# Patient Record
Sex: Male | Born: 1965 | Race: White | Hispanic: No | Smoking: Former smoker
Health system: Southern US, Community
[De-identification: ages and names within clinical notes are randomized; demographics above are authoritative.]

## PROBLEM LIST (undated history)

## (undated) DIAGNOSIS — F603 Borderline personality disorder: Secondary | ICD-10-CM

## (undated) DIAGNOSIS — R569 Unspecified convulsions: Secondary | ICD-10-CM

## (undated) DIAGNOSIS — M25519 Pain in unspecified shoulder: Secondary | ICD-10-CM

## (undated) DIAGNOSIS — G8929 Other chronic pain: Secondary | ICD-10-CM

---

## 2001-09-22 ENCOUNTER — Emergency Department (HOSPITAL_COMMUNITY): Admission: EM | Admit: 2001-09-22 | Discharge: 2001-09-22 | Payer: Self-pay | Admitting: *Deleted

## 2001-09-22 ENCOUNTER — Encounter: Payer: Self-pay | Admitting: *Deleted

## 2005-04-01 ENCOUNTER — Ambulatory Visit (HOSPITAL_COMMUNITY): Admission: RE | Admit: 2005-04-01 | Discharge: 2005-04-01 | Payer: Self-pay | Admitting: Family Medicine

## 2005-07-20 ENCOUNTER — Ambulatory Visit (HOSPITAL_COMMUNITY): Admission: RE | Admit: 2005-07-20 | Discharge: 2005-07-20 | Payer: Self-pay | Admitting: Family Medicine

## 2006-02-23 ENCOUNTER — Ambulatory Visit: Payer: Self-pay | Admitting: Family Medicine

## 2006-02-23 DIAGNOSIS — Q6500 Congenital dislocation of unspecified hip, unilateral: Secondary | ICD-10-CM

## 2006-02-23 DIAGNOSIS — R569 Unspecified convulsions: Secondary | ICD-10-CM | POA: Insufficient documentation

## 2006-02-23 DIAGNOSIS — G43009 Migraine without aura, not intractable, without status migrainosus: Secondary | ICD-10-CM | POA: Insufficient documentation

## 2006-02-23 DIAGNOSIS — F341 Dysthymic disorder: Secondary | ICD-10-CM | POA: Insufficient documentation

## 2006-02-24 LAB — CONVERTED CEMR LAB
Alkaline Phosphatase: 77 units/L (ref 39–117)
Basophils Absolute: 0 10*3/uL (ref 0.0–0.1)
Creatinine, Ser: 0.84 mg/dL (ref 0.40–1.50)
Eosinophils Absolute: 0.1 10*3/uL (ref 0.0–0.7)
Eosinophils Relative: 2 % (ref 0–5)
Glucose, Bld: 87 mg/dL (ref 70–99)
HCT: 44.9 % (ref 39.0–52.0)
Hemoglobin: 15 g/dL (ref 13.0–17.0)
Lymphs Abs: 2.3 10*3/uL (ref 0.7–3.3)
MCV: 97.8 fL (ref 78.0–100.0)
Monocytes Absolute: 0.5 10*3/uL (ref 0.2–0.7)
Phenobarbital: 18.9 ug/mL (ref 15.0–40.0)
Platelets: 268 10*3/uL (ref 150–400)
RDW: 11.9 % (ref 11.5–14.0)
Sodium: 139 meq/L (ref 135–145)
TSH: 1.379 microintl units/mL (ref 0.350–5.50)
Total Bilirubin: 0.6 mg/dL (ref 0.3–1.2)
Total Protein: 7.2 g/dL (ref 6.0–8.3)

## 2006-02-27 ENCOUNTER — Encounter (INDEPENDENT_AMBULATORY_CARE_PROVIDER_SITE_OTHER): Payer: Self-pay | Admitting: Family Medicine

## 2006-03-08 ENCOUNTER — Encounter (INDEPENDENT_AMBULATORY_CARE_PROVIDER_SITE_OTHER): Payer: Self-pay | Admitting: Family Medicine

## 2006-03-09 ENCOUNTER — Encounter (INDEPENDENT_AMBULATORY_CARE_PROVIDER_SITE_OTHER): Payer: Self-pay | Admitting: Family Medicine

## 2006-04-22 ENCOUNTER — Telehealth (INDEPENDENT_AMBULATORY_CARE_PROVIDER_SITE_OTHER): Payer: Self-pay | Admitting: Family Medicine

## 2006-04-27 ENCOUNTER — Encounter (INDEPENDENT_AMBULATORY_CARE_PROVIDER_SITE_OTHER): Payer: Self-pay | Admitting: Family Medicine

## 2006-05-12 ENCOUNTER — Ambulatory Visit: Payer: Self-pay | Admitting: Family Medicine

## 2006-05-14 ENCOUNTER — Encounter (INDEPENDENT_AMBULATORY_CARE_PROVIDER_SITE_OTHER): Payer: Self-pay | Admitting: Family Medicine

## 2006-05-14 DIAGNOSIS — M545 Low back pain: Secondary | ICD-10-CM

## 2006-05-17 ENCOUNTER — Encounter (INDEPENDENT_AMBULATORY_CARE_PROVIDER_SITE_OTHER): Payer: Self-pay | Admitting: Family Medicine

## 2006-05-21 ENCOUNTER — Telehealth (INDEPENDENT_AMBULATORY_CARE_PROVIDER_SITE_OTHER): Payer: Self-pay | Admitting: *Deleted

## 2006-05-24 ENCOUNTER — Telehealth (INDEPENDENT_AMBULATORY_CARE_PROVIDER_SITE_OTHER): Payer: Self-pay | Admitting: *Deleted

## 2006-05-24 ENCOUNTER — Ambulatory Visit (HOSPITAL_COMMUNITY): Admission: RE | Admit: 2006-05-24 | Discharge: 2006-05-24 | Payer: Self-pay | Admitting: Family Medicine

## 2006-06-11 ENCOUNTER — Encounter (INDEPENDENT_AMBULATORY_CARE_PROVIDER_SITE_OTHER): Payer: Self-pay | Admitting: Family Medicine

## 2006-06-25 ENCOUNTER — Encounter (INDEPENDENT_AMBULATORY_CARE_PROVIDER_SITE_OTHER): Payer: Self-pay | Admitting: Family Medicine

## 2006-06-25 ENCOUNTER — Ambulatory Visit: Payer: Self-pay | Admitting: Internal Medicine

## 2006-06-25 ENCOUNTER — Telehealth (INDEPENDENT_AMBULATORY_CARE_PROVIDER_SITE_OTHER): Payer: Self-pay | Admitting: Family Medicine

## 2006-06-25 ENCOUNTER — Ambulatory Visit (HOSPITAL_COMMUNITY): Admission: RE | Admit: 2006-06-25 | Discharge: 2006-06-25 | Payer: Self-pay | Admitting: Family Medicine

## 2006-06-28 ENCOUNTER — Ambulatory Visit: Payer: Self-pay | Admitting: Family Medicine

## 2006-07-08 ENCOUNTER — Encounter (INDEPENDENT_AMBULATORY_CARE_PROVIDER_SITE_OTHER): Payer: Self-pay | Admitting: Family Medicine

## 2006-07-12 ENCOUNTER — Encounter (INDEPENDENT_AMBULATORY_CARE_PROVIDER_SITE_OTHER): Payer: Self-pay | Admitting: Family Medicine

## 2006-07-14 ENCOUNTER — Ambulatory Visit: Payer: Self-pay | Admitting: Family Medicine

## 2006-07-14 ENCOUNTER — Telehealth (INDEPENDENT_AMBULATORY_CARE_PROVIDER_SITE_OTHER): Payer: Self-pay | Admitting: *Deleted

## 2006-07-15 ENCOUNTER — Encounter (INDEPENDENT_AMBULATORY_CARE_PROVIDER_SITE_OTHER): Payer: Self-pay | Admitting: Family Medicine

## 2006-07-16 ENCOUNTER — Telehealth (INDEPENDENT_AMBULATORY_CARE_PROVIDER_SITE_OTHER): Payer: Self-pay | Admitting: Family Medicine

## 2006-07-30 ENCOUNTER — Encounter (INDEPENDENT_AMBULATORY_CARE_PROVIDER_SITE_OTHER): Payer: Self-pay | Admitting: Family Medicine

## 2006-08-24 ENCOUNTER — Encounter (INDEPENDENT_AMBULATORY_CARE_PROVIDER_SITE_OTHER): Payer: Self-pay | Admitting: Family Medicine

## 2006-10-04 ENCOUNTER — Telehealth (INDEPENDENT_AMBULATORY_CARE_PROVIDER_SITE_OTHER): Payer: Self-pay | Admitting: Family Medicine

## 2006-10-04 ENCOUNTER — Encounter (INDEPENDENT_AMBULATORY_CARE_PROVIDER_SITE_OTHER): Payer: Self-pay | Admitting: Family Medicine

## 2006-10-08 ENCOUNTER — Ambulatory Visit: Payer: Self-pay | Admitting: Family Medicine

## 2006-12-14 ENCOUNTER — Ambulatory Visit: Payer: Self-pay | Admitting: Family Medicine

## 2006-12-15 ENCOUNTER — Encounter (INDEPENDENT_AMBULATORY_CARE_PROVIDER_SITE_OTHER): Payer: Self-pay | Admitting: Family Medicine

## 2006-12-15 ENCOUNTER — Telehealth (INDEPENDENT_AMBULATORY_CARE_PROVIDER_SITE_OTHER): Payer: Self-pay | Admitting: *Deleted

## 2006-12-15 LAB — CONVERTED CEMR LAB
Albumin: 4.2 g/dL (ref 3.5–5.2)
BUN: 12 mg/dL (ref 6–23)
Basophils Relative: 0 % (ref 0–1)
CO2: 23 meq/L (ref 19–32)
Calcium: 9 mg/dL (ref 8.4–10.5)
Carbamazepine Lvl: 2.8 ug/mL — ABNORMAL LOW (ref 4.0–12.0)
Chloride: 102 meq/L (ref 96–112)
Creatinine, Ser: 0.85 mg/dL (ref 0.40–1.50)
Glucose, Bld: 87 mg/dL (ref 70–99)
Hemoglobin: 14.4 g/dL (ref 13.0–17.0)
Lymphocytes Relative: 42 % (ref 12–46)
Lymphs Abs: 2.6 10*3/uL (ref 0.7–4.0)
MCHC: 33.1 g/dL (ref 30.0–36.0)
Monocytes Absolute: 0.4 10*3/uL (ref 0.1–1.0)
Monocytes Relative: 6 % (ref 3–12)
Neutro Abs: 3.1 10*3/uL (ref 1.7–7.7)
Neutrophils Relative %: 50 % (ref 43–77)
Phenobarbital: 27.5 ug/mL (ref 15.0–40.0)
Potassium: 4.4 meq/L (ref 3.5–5.3)
RBC: 4.26 M/uL (ref 4.22–5.81)
TSH: 1.645 microintl units/mL (ref 0.350–5.50)
WBC: 6.2 10*3/uL (ref 4.0–10.5)

## 2006-12-20 ENCOUNTER — Encounter (INDEPENDENT_AMBULATORY_CARE_PROVIDER_SITE_OTHER): Payer: Self-pay | Admitting: Family Medicine

## 2006-12-27 ENCOUNTER — Emergency Department (HOSPITAL_COMMUNITY): Admission: EM | Admit: 2006-12-27 | Discharge: 2006-12-27 | Payer: Self-pay | Admitting: Emergency Medicine

## 2007-01-03 ENCOUNTER — Encounter (INDEPENDENT_AMBULATORY_CARE_PROVIDER_SITE_OTHER): Payer: Self-pay | Admitting: Family Medicine

## 2007-01-04 ENCOUNTER — Ambulatory Visit: Payer: Self-pay | Admitting: Family Medicine

## 2007-01-31 ENCOUNTER — Encounter (INDEPENDENT_AMBULATORY_CARE_PROVIDER_SITE_OTHER): Payer: Self-pay | Admitting: Family Medicine

## 2007-02-01 ENCOUNTER — Telehealth (INDEPENDENT_AMBULATORY_CARE_PROVIDER_SITE_OTHER): Payer: Self-pay | Admitting: *Deleted

## 2007-02-01 LAB — CONVERTED CEMR LAB
ALT: 16 units/L (ref 0–53)
Albumin: 4.7 g/dL (ref 3.5–5.2)
Alkaline Phosphatase: 91 units/L (ref 39–117)
Basophils Absolute: 0 10*3/uL (ref 0.0–0.1)
CO2: 29 meq/L (ref 19–32)
Eosinophils Absolute: 0.1 10*3/uL (ref 0.0–0.7)
Eosinophils Relative: 2 % (ref 0–5)
Glucose, Bld: 90 mg/dL (ref 70–99)
HCT: 44.4 % (ref 39.0–52.0)
Lymphocytes Relative: 42 % (ref 12–46)
Lymphs Abs: 2.2 10*3/uL (ref 0.7–4.0)
Neutrophils Relative %: 50 % (ref 43–77)
Platelets: 232 10*3/uL (ref 150–400)
Potassium: 4.7 meq/L (ref 3.5–5.3)
RDW: 12.2 % (ref 11.5–15.5)
Sodium: 140 meq/L (ref 135–145)
Total Bilirubin: 0.2 mg/dL — ABNORMAL LOW (ref 0.3–1.2)
Total Protein: 7.2 g/dL (ref 6.0–8.3)
WBC: 5.3 10*3/uL (ref 4.0–10.5)

## 2007-02-03 ENCOUNTER — Ambulatory Visit: Payer: Self-pay | Admitting: Family Medicine

## 2007-02-14 ENCOUNTER — Encounter (INDEPENDENT_AMBULATORY_CARE_PROVIDER_SITE_OTHER): Payer: Self-pay | Admitting: Family Medicine

## 2007-03-16 ENCOUNTER — Ambulatory Visit: Payer: Self-pay | Admitting: Family Medicine

## 2007-03-16 DIAGNOSIS — F988 Other specified behavioral and emotional disorders with onset usually occurring in childhood and adolescence: Secondary | ICD-10-CM | POA: Insufficient documentation

## 2007-03-16 DIAGNOSIS — F17200 Nicotine dependence, unspecified, uncomplicated: Secondary | ICD-10-CM

## 2007-03-21 ENCOUNTER — Encounter (INDEPENDENT_AMBULATORY_CARE_PROVIDER_SITE_OTHER): Payer: Self-pay | Admitting: Family Medicine

## 2007-03-21 ENCOUNTER — Telehealth (INDEPENDENT_AMBULATORY_CARE_PROVIDER_SITE_OTHER): Payer: Self-pay | Admitting: Family Medicine

## 2007-03-29 ENCOUNTER — Telehealth (INDEPENDENT_AMBULATORY_CARE_PROVIDER_SITE_OTHER): Payer: Self-pay | Admitting: *Deleted

## 2007-03-29 ENCOUNTER — Ambulatory Visit: Payer: Self-pay | Admitting: Family Medicine

## 2007-03-31 ENCOUNTER — Encounter (INDEPENDENT_AMBULATORY_CARE_PROVIDER_SITE_OTHER): Payer: Self-pay | Admitting: Family Medicine

## 2007-03-31 ENCOUNTER — Telehealth (INDEPENDENT_AMBULATORY_CARE_PROVIDER_SITE_OTHER): Payer: Self-pay | Admitting: *Deleted

## 2007-04-05 ENCOUNTER — Encounter (INDEPENDENT_AMBULATORY_CARE_PROVIDER_SITE_OTHER): Payer: Self-pay | Admitting: Family Medicine

## 2007-04-07 ENCOUNTER — Telehealth (INDEPENDENT_AMBULATORY_CARE_PROVIDER_SITE_OTHER): Payer: Self-pay | Admitting: *Deleted

## 2007-04-07 ENCOUNTER — Encounter (INDEPENDENT_AMBULATORY_CARE_PROVIDER_SITE_OTHER): Payer: Self-pay | Admitting: Family Medicine

## 2007-04-21 ENCOUNTER — Emergency Department (HOSPITAL_COMMUNITY): Admission: EM | Admit: 2007-04-21 | Discharge: 2007-04-22 | Payer: Self-pay | Admitting: Emergency Medicine

## 2007-04-22 ENCOUNTER — Ambulatory Visit: Payer: Self-pay | Admitting: *Deleted

## 2007-04-22 ENCOUNTER — Inpatient Hospital Stay (HOSPITAL_COMMUNITY): Admission: AD | Admit: 2007-04-22 | Discharge: 2007-04-25 | Payer: Self-pay | Admitting: *Deleted

## 2007-05-03 ENCOUNTER — Ambulatory Visit: Payer: Self-pay | Admitting: Family Medicine

## 2007-05-03 ENCOUNTER — Telehealth (INDEPENDENT_AMBULATORY_CARE_PROVIDER_SITE_OTHER): Payer: Self-pay | Admitting: Family Medicine

## 2007-05-16 ENCOUNTER — Telehealth (INDEPENDENT_AMBULATORY_CARE_PROVIDER_SITE_OTHER): Payer: Self-pay | Admitting: Family Medicine

## 2007-05-17 ENCOUNTER — Ambulatory Visit: Payer: Self-pay | Admitting: Family Medicine

## 2007-05-17 DIAGNOSIS — F528 Other sexual dysfunction not due to a substance or known physiological condition: Secondary | ICD-10-CM

## 2007-05-17 LAB — CONVERTED CEMR LAB
HDL goal, serum: 40 mg/dL
LDL Goal: 160 mg/dL

## 2007-06-14 ENCOUNTER — Ambulatory Visit: Payer: Self-pay | Admitting: Family Medicine

## 2007-06-14 DIAGNOSIS — B079 Viral wart, unspecified: Secondary | ICD-10-CM | POA: Insufficient documentation

## 2007-06-14 DIAGNOSIS — R350 Frequency of micturition: Secondary | ICD-10-CM

## 2007-06-14 LAB — CONVERTED CEMR LAB
Bilirubin Urine: NEGATIVE
Blood in Urine, dipstick: NEGATIVE
Glucose, Urine, Semiquant: NEGATIVE
Ketones, urine, test strip: NEGATIVE
Specific Gravity, Urine: 1.015

## 2007-07-13 ENCOUNTER — Ambulatory Visit: Payer: Self-pay | Admitting: Family Medicine

## 2007-08-01 ENCOUNTER — Telehealth (INDEPENDENT_AMBULATORY_CARE_PROVIDER_SITE_OTHER): Payer: Self-pay | Admitting: Family Medicine

## 2007-08-12 ENCOUNTER — Encounter (INDEPENDENT_AMBULATORY_CARE_PROVIDER_SITE_OTHER): Payer: Self-pay | Admitting: Family Medicine

## 2007-09-14 ENCOUNTER — Encounter (INDEPENDENT_AMBULATORY_CARE_PROVIDER_SITE_OTHER): Payer: Self-pay | Admitting: Family Medicine

## 2007-11-11 ENCOUNTER — Telehealth (INDEPENDENT_AMBULATORY_CARE_PROVIDER_SITE_OTHER): Payer: Self-pay | Admitting: *Deleted

## 2007-11-21 ENCOUNTER — Ambulatory Visit: Payer: Self-pay | Admitting: Family Medicine

## 2007-11-21 DIAGNOSIS — E669 Obesity, unspecified: Secondary | ICD-10-CM | POA: Insufficient documentation

## 2007-11-25 ENCOUNTER — Telehealth (INDEPENDENT_AMBULATORY_CARE_PROVIDER_SITE_OTHER): Payer: Self-pay | Admitting: *Deleted

## 2007-12-05 ENCOUNTER — Ambulatory Visit: Payer: Self-pay | Admitting: Family Medicine

## 2008-01-11 ENCOUNTER — Encounter (INDEPENDENT_AMBULATORY_CARE_PROVIDER_SITE_OTHER): Payer: Self-pay | Admitting: Family Medicine

## 2008-01-16 ENCOUNTER — Encounter (INDEPENDENT_AMBULATORY_CARE_PROVIDER_SITE_OTHER): Payer: Self-pay | Admitting: Family Medicine

## 2008-02-28 ENCOUNTER — Encounter (INDEPENDENT_AMBULATORY_CARE_PROVIDER_SITE_OTHER): Payer: Self-pay | Admitting: Family Medicine

## 2008-03-07 ENCOUNTER — Telehealth (INDEPENDENT_AMBULATORY_CARE_PROVIDER_SITE_OTHER): Payer: Self-pay | Admitting: *Deleted

## 2008-03-09 ENCOUNTER — Telehealth (INDEPENDENT_AMBULATORY_CARE_PROVIDER_SITE_OTHER): Payer: Self-pay | Admitting: *Deleted

## 2008-03-13 ENCOUNTER — Telehealth (INDEPENDENT_AMBULATORY_CARE_PROVIDER_SITE_OTHER): Payer: Self-pay | Admitting: *Deleted

## 2008-04-30 ENCOUNTER — Encounter (INDEPENDENT_AMBULATORY_CARE_PROVIDER_SITE_OTHER): Payer: Self-pay | Admitting: Family Medicine

## 2008-05-08 ENCOUNTER — Encounter (INDEPENDENT_AMBULATORY_CARE_PROVIDER_SITE_OTHER): Payer: Self-pay | Admitting: Family Medicine
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
## 2010-06-07 ENCOUNTER — Emergency Department (HOSPITAL_COMMUNITY)
Admission: EM | Admit: 2010-06-07 | Discharge: 2010-06-09 | Disposition: A | Discharge status: Other Acute Inpatient Hospital | Payer: Self-pay | Attending: Emergency Medicine | Admitting: Emergency Medicine

## 2010-06-07 DIAGNOSIS — F3289 Other specified depressive episodes: Secondary | ICD-10-CM | POA: Insufficient documentation

## 2010-06-07 DIAGNOSIS — T423X4A Poisoning by barbiturates, undetermined, initial encounter: Secondary | ICD-10-CM | POA: Insufficient documentation

## 2010-06-07 DIAGNOSIS — Z79899 Other long term (current) drug therapy: Secondary | ICD-10-CM | POA: Insufficient documentation

## 2010-06-07 DIAGNOSIS — E7800 Pure hypercholesterolemia, unspecified: Secondary | ICD-10-CM | POA: Insufficient documentation

## 2010-06-07 DIAGNOSIS — F329 Major depressive disorder, single episode, unspecified: Secondary | ICD-10-CM | POA: Insufficient documentation

## 2010-06-07 DIAGNOSIS — T423X2A Poisoning by barbiturates, intentional self-harm, initial encounter: Secondary | ICD-10-CM | POA: Insufficient documentation

## 2010-06-07 DIAGNOSIS — R404 Transient alteration of awareness: Secondary | ICD-10-CM | POA: Insufficient documentation

## 2010-06-07 DIAGNOSIS — G40802 Other epilepsy, not intractable, without status epilepticus: Secondary | ICD-10-CM | POA: Insufficient documentation

## 2010-06-07 LAB — RAPID URINE DRUG SCREEN, HOSP PERFORMED
Barbiturates: POSITIVE — AB
Benzodiazepines: NOT DETECTED
Opiates: NOT DETECTED

## 2010-06-07 LAB — COMPREHENSIVE METABOLIC PANEL
ALT: 16 U/L (ref 0–53)
AST: 18 U/L (ref 0–37)
CO2: 28 mEq/L (ref 19–32)
Chloride: 98 mEq/L (ref 96–112)
Creatinine, Ser: 0.69 mg/dL (ref 0.4–1.5)
GFR calc Af Amer: >60 mL/min (ref >60)
GFR calc non Af Amer: >60 mL/min (ref >60)
Total Bilirubin: 0.3 mg/dL (ref 0.3–1.2)

## 2010-06-07 LAB — DIFFERENTIAL
Basophils Absolute: 0 10*3/uL (ref 0.0–0.1)
Basophils Relative: 0 % (ref 0–1)
Lymphocytes Relative: 36 % (ref 12–46)
Neutro Abs: 2.7 10*3/uL (ref 1.7–7.7)
Neutrophils Relative %: 55 % (ref 43–77)

## 2010-06-07 LAB — URINALYSIS, ROUTINE W REFLEX MICROSCOPIC
Glucose, UA: NEGATIVE mg/dL
Protein, ur: NEGATIVE mg/dL
Specific Gravity, Urine: <1.005 — ABNORMAL LOW (ref 1.005–1.030)
pH: 6.5 (ref 5.0–8.0)

## 2010-06-07 LAB — CBC
Hemoglobin: 13.7 g/dL (ref 13.0–17.0)
MCHC: 34 g/dL (ref 30.0–36.0)
RBC: 4.11 MIL/uL — ABNORMAL LOW (ref 4.22–5.81)
WBC: 4.9 10*3/uL (ref 4.0–10.5)

## 2010-06-10 ENCOUNTER — Inpatient Hospital Stay (HOSPITAL_COMMUNITY)
Admission: AD | Admit: 2010-06-10 | Discharge: 2010-06-16 | DRG: 885 | Disposition: A | Discharge status: Home / Self Care | Payer: 59 | Source: Ambulatory Visit | Attending: Psychiatry | Admitting: Psychiatry

## 2010-06-10 DIAGNOSIS — T423X4A Poisoning by barbiturates, undetermined, initial encounter: Secondary | ICD-10-CM

## 2010-06-10 DIAGNOSIS — R45850 Homicidal ideations: Secondary | ICD-10-CM

## 2010-06-10 DIAGNOSIS — T423X2A Poisoning by barbiturates, intentional self-harm, initial encounter: Secondary | ICD-10-CM

## 2010-06-10 DIAGNOSIS — Z5900 Homelessness unspecified: Secondary | ICD-10-CM

## 2010-06-10 DIAGNOSIS — F259 Schizoaffective disorder, unspecified: Principal | ICD-10-CM

## 2010-06-10 DIAGNOSIS — F3289 Other specified depressive episodes: Secondary | ICD-10-CM

## 2010-06-10 DIAGNOSIS — F339 Major depressive disorder, recurrent, unspecified: Secondary | ICD-10-CM

## 2010-06-10 DIAGNOSIS — Z560 Unemployment, unspecified: Secondary | ICD-10-CM

## 2010-06-10 DIAGNOSIS — F329 Major depressive disorder, single episode, unspecified: Secondary | ICD-10-CM

## 2010-06-10 DIAGNOSIS — F17200 Nicotine dependence, unspecified, uncomplicated: Secondary | ICD-10-CM

## 2010-06-10 DIAGNOSIS — G40909 Epilepsy, unspecified, not intractable, without status epilepticus: Secondary | ICD-10-CM
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
## 2010-06-19 NOTE — Discharge Summary (Signed)
NAMEEDGEL, DEGNAN NO.:  [id number]  MEDICAL RECORD NO.:  [id number]  LOCATION:  0304                          FACILITY:  BH  PHYSICIAN:  Franchot Gallo, MD     DATE OF BIRTH:  02/07/1965  DATE OF ADMISSION:  06/09/2010 DATE OF DISCHARGE:  06/16/2010                              DISCHARGE SUMMARY   REASON FOR ADMISSION:  This is a 45 year old male who was admitted after overdosing on approximately 26 phenobarbital tablets.  He was having some conflict with his fiancee.  He took the medications in front of her and as she called the police, he "walked out of the house and found a place to take a nap".  He was found down stairs in the apartment complex by the police and was taken to the emergency department for further treatment.  He was reporting thinking about suicide all the time with having numerous suicide gestures.  FINAL IMPRESSION:  Axis I: Schizoaffective disorder depressed type, severe. Axis II: Deferred. Axis III: History of seizure disorder. Axis IV: Limited primary support group, unemployment, financial constraints, homelessness, chronic mental issues, chronic medical illness. Axis V: 50 to 55.  The patient was admitted to the adult milieu on the mood disorder group. He was started on Tegretol 600 mg q.h.s. to address his seizure disorder.  Also started on phenobarbital taking 2 tablets to address his seizures.  We also initiated Zoloft 50 mg to address his depressive symptoms as he stated he had received benefit from this medication in the past.  Risperdal 1 mg q.h.s. to address possible psychotic symptoms. Vistaril was available on a p.r.n. basis for sleep and the patient was monitored for dangerousness to self or others.  We had contact with his support system who we identified as his ex-girlfriend to assess any safety issues and to provide support.  The patient was reporting problems with homelessness and uncertain where he would go  after discharge.  He was however sleeping well.  His appetite was good, rating his depression at an 8 on a scale of 1 to 10.  Was continued to report suicidal thoughts with possible intent.  Denied any homicidal thoughts. States his voices were fading but still present.  We increased his Zoloft for depressive symptoms and the patient became a one-to-one for safety issues on the unit.  The patient states he was feeling better than he had in years.  His sleep and appetite were good.  Having mild depressive symptoms.  He was promising safety on the unit and his one-to-one was discontinued.  He was actively attending planning groups and felt he was much improved and was ready to be discharged.  He was reporting plans to live with a friend in [place] and needs to get in touch with his fiancee to get his food stamps and other belongings.  He was reporting he had little sleep the night before and was endorsing his depression was good in the morning but returned in the evening, having some slight auditory hallucinations.  Denied any visual hallucinations.  Was tolerating his medications.  We added Ativan to help with his anxiety and increased his Zoloft to help with his symptoms of  depression and anxiety.  He was perseverating and ruminating over his belongings that were taken from him in the emergency department at [facility], otherwise he was reporting doing well.  He was reporting some muscle twitching in his feet and hands and Cogentin was initiated.  On day of discharge the patient's sleep was good, appetite was good.  His depression had resolved, rating it is a 0 on a scale of 1 to 10.  Adamantly denied any suicidal or homicidal thoughts or auditory visualizations.  No significant anxiety.  No medication side effects.  He was reporting that he will be living with his friend in [place] and was agreeable to following up at [facility] in [place].  His discharge medications included Cogentin 1 mg  q.h.s., hydroxyzine 50 mg q.h.s. sleep, lorazepam 0.5 mg b.i.d. p.r.n. anxiety, Risperdal 1 mg q.h.s., Zoloft 150 mg daily, phenobarbital 97.2 two tablets daily, Tegretol 200 mg taking three q.h.s.  The patient was to stop taking his Xanax.  His follow-up appointment was at [facility] in [place] on Friday June 15th, phone number [phone].     Landry Corporal, N.P.   ______________________________ Franchot Gallo, MD    JO/MEDQ  D:  06/18/2010  T:  06/18/2010  Job:  [id number]  Electronically Signed by [name].P. on 06/19/2010 09:09:06 AM Electronically Signed by Franchot Gallo MD on 06/19/2010 04:52:12 PM

## 2010-09-30 LAB — CBC
HCT: 35.5 — ABNORMAL LOW
Hemoglobin: 12.4 — ABNORMAL LOW
WBC: 8.6

## 2010-09-30 LAB — RAPID URINE DRUG SCREEN, HOSP PERFORMED
Benzodiazepines: NOT DETECTED
Cocaine: NOT DETECTED
Tetrahydrocannabinol: NOT DETECTED

## 2010-09-30 LAB — PHENOBARBITAL LEVEL: Phenobarbital: 26.8

## 2010-09-30 LAB — DIFFERENTIAL
Eosinophils Relative: 1
Lymphocytes Relative: 14
Lymphs Abs: 1.2
Monocytes Absolute: 0.6

## 2010-09-30 LAB — ETHANOL: Alcohol, Ethyl (B): <5

## 2011-06-13 ENCOUNTER — Encounter (HOSPITAL_COMMUNITY): Payer: Self-pay

## 2011-06-13 ENCOUNTER — Emergency Department (HOSPITAL_COMMUNITY)
Admission: EM | Admit: 2011-06-13 | Discharge: 2011-06-13 | Disposition: A | Discharge status: Home / Self Care | Payer: Self-pay | Attending: Emergency Medicine | Admitting: Emergency Medicine

## 2011-06-13 DIAGNOSIS — F17200 Nicotine dependence, unspecified, uncomplicated: Secondary | ICD-10-CM | POA: Insufficient documentation

## 2011-06-13 DIAGNOSIS — Y998 Other external cause status: Secondary | ICD-10-CM | POA: Insufficient documentation

## 2011-06-13 DIAGNOSIS — T1590XA Foreign body on external eye, part unspecified, unspecified eye, initial encounter: Secondary | ICD-10-CM | POA: Insufficient documentation

## 2011-06-13 DIAGNOSIS — Y93H9 Activity, other involving exterior property and land maintenance, building and construction: Secondary | ICD-10-CM | POA: Insufficient documentation

## 2011-06-13 MED ORDER — TETRACAINE HCL 0.5 % OP SOLN
OPHTHALMIC | Status: AC
  Administered 2011-06-13: 16:00:00
  Filled 2011-06-13: qty 2

## 2011-06-13 MED ORDER — KETOROLAC TROMETHAMINE 0.5 % OP SOLN: 1.0000 [drp] | Freq: Four times a day (QID) | OPHTHALMIC | Status: AC

## 2011-06-13 NOTE — ED Notes (Signed)
Foreign body in right eye. Does not know what

## 2011-06-13 NOTE — Discharge Instructions (Signed)
Eye, Foreign Body  A foreign body is an object that should not be there. The object could be near, on, or in the eye.  HOME CARE  If your doctor prescribes an eye patch:   Keep the eye patch on. Do this until you see your doctor again.   Do not remove the patch to put in medicine unless your doctor tells you.   Retape it as it was before:   When replacing the patch.   If the patch comes loose.   Do not drive or use machinery.   Only take medicine as told by your doctor.  If your doctor does not prescribe an eye patch:   Keep the eye closed as much as possible.   Do not rub the eye.   Wear dark glasses in bright light.   Do not wear contact lenses until the eye feels normal, or as told by your doctor.   Wear protective eye covering, especially when using high speed tools.   Only take medicine as told by your doctor.  GET HELP RIGHT AWAY IF:    Your pain gets worse.   Your vision changes.   You have problems with the eye patch.   The injury gets larger.   There is fluid (discharge) coming from the eye.   You get puffiness (swelling) and soreness.   You have an oral temperature above 102 F (38.9 C), not controlled by medicine.   Your baby is older than 3 months with a rectal temperature of 102 F (38.9 C) or higher.   Your baby is 3 months old or younger with a rectal temperature of 100.4 F (38 C) or higher.  MAKE SURE YOU:    Understand these instructions.   Will watch your condition.   Will get help right away if you are not doing well or get worse.  Document Released: 06/11/2009 Document Revised: 12/11/2010 Document Reviewed: 06/11/2009  [employer] Patient Information 2012 [employer].

## 2011-06-17 NOTE — ED Provider Notes (Signed)
History     CSN: [id number]  Arrival date & time 06/13/11  1446   First MD Initiated Contact with Patient 06/13/11 1533      Chief Complaint  Patient presents with  . 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  States he was not wearing eye protection at the time.  He c/o pain to the right eye with blinking.  He denies fever, neck pain or contact use  Patient is a 46 y.o. male presenting with foreign body in eye. The history is provided by the patient.  Foreign Body in Eye This is a new problem. The current episode started today. The problem occurs constantly. The problem has been unchanged. Associated symptoms include a visual change. Pertinent negatives include no chills, congestion, fever, headaches, nausea, neck pain, numbness, rash, sore throat, swollen glands, vertigo, vomiting or weakness. Exacerbated by: blinking, rubbing his eye. Treatments tried: rinsing his eye with water. The treatment provided no relief.    History reviewed. No pertinent past medical history.  History reviewed. No pertinent past surgical history.  No family history on file.  History  Substance Use Topics  . Smoking status: Current Everyday Smoker  . Smokeless tobacco: Not on file  . Alcohol Use: No      Review of Systems  Constitutional: Negative for fever, chills and appetite change.  HENT: Negative for congestion, sore throat, facial swelling, neck pain and neck stiffness.   Eyes: Positive for photophobia, pain, redness and visual disturbance. Negative for discharge and itching.  Respiratory: Negative for chest tightness.   Gastrointestinal: Negative for nausea and vomiting.  Skin: Negative for rash.  Neurological: Negative for dizziness, vertigo, facial asymmetry, weakness, numbness and headaches.  All other  systems reviewed and are negative.    Allergies  Grapefruit flavor  Home Medications   Current Outpatient Rx  Name Route Sig Dispense Refill  . KETOROLAC TROMETHAMINE 0.5 % OP SOLN Right Eye Place 1 drop into the right eye 4 (four) times daily. 5 mL 0    BP 150/80  Pulse 96  Temp 98 F (36.7 C) (Oral)  Resp 20  Ht 5\' 7"  (1.702 m)  Wt 200 lb (90.719 kg)  BMI 31.32 kg/m2  SpO2 100%  Physical Exam  Nursing note and vitals reviewed. Constitutional: He appears well-developed and well-nourished. No distress.  HENT:  Head: Normocephalic and atraumatic.  Mouth/Throat: Oropharynx is clear and moist.  Eyes: EOM are normal. Pupils are equal, round, and reactive to light. Right eye exhibits no chemosis and no discharge. Foreign body present in the right eye. Left eye exhibits no chemosis and no discharge. No foreign body present in the left eye. Right conjunctiva is injected. Right conjunctiva has no hemorrhage. Left conjunctiva is not injected. Left conjunctiva has no hemorrhage. No scleral icterus.  Fundoscopic exam:      The right eye shows no exudate.       FB underneath lateral right upper eyelid    ED Course  Procedures (including critical care time)  Labs Reviewed - No data to display   1. Foreign body, eye       MDM     Small FB removed from underneath the right upper eyelid using a moistened cotton swab.  Pain improved.  Pt tolerated well.     Prescribed: Ketorolac ophth drops   Patient / Family / Caregiver understand  and agree with initial ED impression and plan with expectations set for ED visit. Pt stable in ED with no significant deterioration in condition. Pt feels improved after observation and/or treatment in ED.    Cheresa Siers L. [place], [place] 06/17/11 2238

## 2011-06-18 NOTE — ED Provider Notes (Signed)
Medical screening examination/treatment/procedure(s) were performed by non-physician practitioner and as supervising physician I was immediately available for consultation/collaboration.  Dustan Hyams, MD 06/18/11 1524 

## 2011-07-19 ENCOUNTER — Encounter (HOSPITAL_COMMUNITY): Payer: Self-pay

## 2011-07-19 ENCOUNTER — Emergency Department (HOSPITAL_COMMUNITY)
Admission: EM | Admit: 2011-07-19 | Discharge: 2011-07-19 | Disposition: A | Discharge status: Home / Self Care | Payer: Worker's Compensation | Attending: Emergency Medicine | Admitting: Emergency Medicine

## 2011-07-19 DIAGNOSIS — S61219A Laceration without foreign body of unspecified finger without damage to nail, initial encounter: Secondary | ICD-10-CM

## 2011-07-19 DIAGNOSIS — W260XXA Contact with knife, initial encounter: Secondary | ICD-10-CM | POA: Insufficient documentation

## 2011-07-19 DIAGNOSIS — Y9289 Other specified places as the place of occurrence of the external cause: Secondary | ICD-10-CM | POA: Insufficient documentation

## 2011-07-19 DIAGNOSIS — F17200 Nicotine dependence, unspecified, uncomplicated: Secondary | ICD-10-CM | POA: Insufficient documentation

## 2011-07-19 DIAGNOSIS — S61209A Unspecified open wound of unspecified finger without damage to nail, initial encounter: Secondary | ICD-10-CM | POA: Insufficient documentation

## 2011-07-19 DIAGNOSIS — Y990 Civilian activity done for income or pay: Secondary | ICD-10-CM | POA: Insufficient documentation

## 2011-07-19 HISTORY — DX: Unspecified convulsions: R56.9

## 2011-07-19 HISTORY — DX: Borderline personality disorder: F60.3

## 2011-07-19 MED ORDER — TETANUS-DIPHTH-ACELL PERTUSSIS 5-2.5-18.5 LF-MCG/0.5 IM SUSP
0.5000 mL | Freq: Once | INTRAMUSCULAR | Status: AC
  Administered 2011-07-19: 0.5 mL via INTRAMUSCULAR
  Filled 2011-07-19: qty 0.5

## 2011-07-19 NOTE — ED Notes (Signed)
Pt states he cut right index finger the previous night at work. No bleeding at present pt put super glue on wound.

## 2011-07-19 NOTE — ED Provider Notes (Signed)
Medical screening examination/treatment/procedure(s) were performed by non-physician practitioner and as supervising physician I was immediately available for consultation/collaboration.   Chesni Vos L Twain Stenseth, MD 07/19/11 1513 

## 2011-07-19 NOTE — ED Provider Notes (Signed)
History     CSN: [id number]  Arrival date & time 07/19/11  4098   First MD Initiated Contact with Patient 07/19/11 [phone]      Chief Complaint  Patient presents with  . Extremity Laceration    (Consider location/radiation/quality/duration/timing/severity/associated sxs/prior treatment) HPI Comments: Pt works as a Midwife.  He was cleaning a knife ~ 2045 last PM and cut R 2nd finger.  He washed it, held pressure on it until it stopped bleeding and then applied super glue.    ? Last dT.  R hand dominant.  The history is provided by the patient. No language interpreter was used.    Past Medical History  Diagnosis Date  . Seizures   . Borderline personality disorder     No past surgical history on file.  No family history on file.  History  Substance Use Topics  . Smoking status: Current Everyday Smoker  . Smokeless tobacco: Not on file  . Alcohol Use: Yes      Review of Systems  Skin: Positive for wound.  Neurological: Negative for weakness and numbness.  All other systems reviewed and are negative.    Allergies  Grapefruit flavor  Home Medications  No current outpatient prescriptions on file.  BP 137/97  Pulse 78  Temp 98 F (36.7 C)  Resp 16  Ht 5\' 7"  (1.702 m)  Wt 192 lb (87.091 kg)  BMI 30.07 kg/m2  SpO2 100%  Physical Exam  Nursing note and vitals reviewed. Constitutional: He is oriented to person, place, and time. He appears well-developed and well-nourished.  HENT:  Head: Normocephalic and atraumatic.  Eyes: EOM are normal.  Neck: Normal range of motion.  Cardiovascular: Normal rate, regular rhythm, normal heart sounds and intact distal pulses.   Pulmonary/Chest: Effort normal and breath sounds normal. No respiratory distress.  Abdominal: Soft. He exhibits no distension. There is no tenderness.  Musculoskeletal: Normal range of motion. He exhibits tenderness.       Right hand: He exhibits tenderness and laceration. He exhibits normal range of  motion, no bony tenderness, normal two-point discrimination, normal capillary refill, no deformity and no swelling. normal sensation noted. Normal strength noted.       Hands: Neurological: He is alert and oriented to person, place, and time.  Skin: Skin is warm and dry.  Psychiatric: He has a normal mood and affect. Judgment normal.    ED Course  Procedures (including critical care time)  Labs Reviewed - No data to display No results found.   1. Finger laceration       MDM  Keep dry and avoid trauma.  DT given        Evalina Field, PA 07/19/11 [phone]

## 2012-03-30 ENCOUNTER — Other Ambulatory Visit (HOSPITAL_COMMUNITY): Payer: Self-pay | Admitting: Internal Medicine

## 2012-03-30 DIAGNOSIS — S43421A Sprain of right rotator cuff capsule, initial encounter: Secondary | ICD-10-CM

## 2012-03-30 DIAGNOSIS — M7150 Other bursitis, not elsewhere classified, unspecified site: Secondary | ICD-10-CM

## 2012-04-04 ENCOUNTER — Ambulatory Visit (HOSPITAL_COMMUNITY): Payer: Self-pay

## 2012-04-04 ENCOUNTER — Ambulatory Visit (HOSPITAL_COMMUNITY): Admission: RE | Admit: 2012-04-04 | Payer: Self-pay | Source: Ambulatory Visit

## 2012-04-05 ENCOUNTER — Ambulatory Visit (HOSPITAL_COMMUNITY)
Admission: RE | Admit: 2012-04-05 | Discharge: 2012-04-05 | Disposition: A | Discharge status: Home / Self Care | Payer: Self-pay | Source: Ambulatory Visit | Attending: Internal Medicine | Admitting: Internal Medicine

## 2012-04-05 DIAGNOSIS — S43421A Sprain of right rotator cuff capsule, initial encounter: Secondary | ICD-10-CM

## 2012-04-05 DIAGNOSIS — X58XXXA Exposure to other specified factors, initial encounter: Secondary | ICD-10-CM | POA: Insufficient documentation

## 2012-04-05 DIAGNOSIS — S43439A Superior glenoid labrum lesion of unspecified shoulder, initial encounter: Secondary | ICD-10-CM | POA: Insufficient documentation

## 2012-04-05 DIAGNOSIS — M898X9 Other specified disorders of bone, unspecified site: Secondary | ICD-10-CM | POA: Insufficient documentation

## 2012-04-05 DIAGNOSIS — M5030 Other cervical disc degeneration, unspecified cervical region: Secondary | ICD-10-CM | POA: Insufficient documentation

## 2012-04-05 DIAGNOSIS — M542 Cervicalgia: Secondary | ICD-10-CM | POA: Insufficient documentation

## 2012-04-05 DIAGNOSIS — S46819A Strain of other muscles, fascia and tendons at shoulder and upper arm level, unspecified arm, initial encounter: Secondary | ICD-10-CM | POA: Insufficient documentation

## 2012-04-05 DIAGNOSIS — M7150 Other bursitis, not elsewhere classified, unspecified site: Secondary | ICD-10-CM

## 2012-04-05 DIAGNOSIS — M25519 Pain in unspecified shoulder: Secondary | ICD-10-CM | POA: Insufficient documentation

## 2012-05-16 ENCOUNTER — Ambulatory Visit (HOSPITAL_COMMUNITY): Admission: RE | Admit: 2012-05-16 | Payer: Self-pay | Source: Ambulatory Visit

## 2012-05-18 ENCOUNTER — Ambulatory Visit (HOSPITAL_COMMUNITY)
Admission: RE | Admit: 2012-05-18 | Discharge: 2012-05-18 | Disposition: A | Discharge status: Home / Self Care | Payer: Self-pay | Source: Ambulatory Visit | Attending: Orthopaedic Surgery | Admitting: Orthopaedic Surgery

## 2012-05-18 DIAGNOSIS — M25519 Pain in unspecified shoulder: Secondary | ICD-10-CM | POA: Insufficient documentation

## 2012-05-18 DIAGNOSIS — M6281 Muscle weakness (generalized): Secondary | ICD-10-CM | POA: Insufficient documentation

## 2012-05-18 DIAGNOSIS — IMO0001 Reserved for inherently not codable concepts without codable children: Secondary | ICD-10-CM | POA: Insufficient documentation

## 2012-05-18 NOTE — Evaluation (Signed)
Occupational Therapy Evaluation  Patient Details  Name: Pedro Benson MRN: [id number] Date of Birth: 08-22-1965  Today's Date: 05/18/2012 Time: 1109-1150 OT Time Calculation (min): 41 min OT eval 1109-1150 41'  Visit#: 1 of 8  Re-eval: 06/15/12     Authorization: Self pay  Authorization Time Period:    Authorization Visit#:   of     Past Medical History:  Past Medical History  Diagnosis Date  . Seizures   . Borderline personality disorder    Past Surgical History: No past surgical history on file.  Subjective Symptoms/Limitations Symptoms: S: One day I woke up and I had pain in my shoulder. It doesn't hurt if I have days off from work, but once I go back to work it hurts. Pertinent History: Approx. 2-3 months ago, Pedro Benson woke with his right arm hurting. No report of injury. Pain starts at trapezius region of shoulder and radiates down to deltiod region. Dr. Hilda Lias has referred patient for occupational therapy evaluation and treatment.  Repetition: Increases Symptoms Special Tests: DASH score of 43.1 with ideal score of 0. Patient Stated Goals: To get better and have no pain when using arm. Pain Assessment Currently in Pain?: Yes Pain Score:   5 Pain Location: Shoulder Pain Orientation: Right Pain Type: Acute pain   Assessment  05/18/12 1100  Assessment  Diagnosis Rt. shoulder pain  Next MD Visit In June  Precautions  Precautions None  Balance Screen  Has the patient fallen in the past 6 months No  Home Living  Lives With (mother)  Prior Function  Level of Independence Independent with basic ADLs;Independent with gait  Able to Take Stairs? 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  ADL  ADL Comments Pain with all daily activities  Vision - History  Baseline Vision Wears glasses all the time  Cognition  Overall Cognitive Status Within Functional Limits for tasks assessed  Arousal/Alertness  Awake/alert  Orientation Level Oriented X4  RUE AROM (degrees)  RUE Overall AROM Comments WFL by observation  RUE Strength  Right Shoulder Flexion (4-/5)  Right Shoulder ABduction (4-/5)  Right Shoulder Internal Rotation 5/5  Right Shoulder External Rotation 5/5  Palpation  Palpation Max fascial restrictions in upper arm, trapezius, and scapularis region  Written Expression  Dominant Hand Right     Occupational Therapy Assessment and Plan OT Assessment and Plan Clinical Impression Statement: A: Patient is a 47 year old male with increased pain and strength in his right shoulder causing decreased I with desired daily activities. Skilled OT intervention is indicated to increase pain free mobility and strength and decrease pain and fascial restricitons in his right shoulder. Pt will benefit from skilled therapeutic intervention in order to improve on the following deficits: Increased fascial restricitons;Pain;Decreased strength Rehab Potential: Excellent OT Frequency: Min 2X/week OT Duration: 4 weeks OT Treatment/Interventions: Self-care/ADL training;Therapeutic activities;Therapeutic exercise;Modalities;Manual therapy;Patient/family education OT Plan: P: Skilled OT intervention is indicated to increase pain free mobility and strength and decrease pain and fascial restricitons in his right shoulder in order to complete all desired acitivities with his right arm. Treatment Plan: MFR and manual stretching to left shoulder and associated areas. AAROM progressing to AROM in supine and seated. Scapular stability and strengtheing activities. shoulder stretches to decrease rotator cuff tightness    Goals Short Term Goals Time to Complete Short Term Goals: 2 weeks Short Term Goal 1: Patient will be educated on HEP.  Short Term Goal 2:  Patient will decrease his right shoulder pain to 3/10 when performing work duties.  Short Term Goal 3: Patient will decrease fascial restrictions to mod in his  right shoulder region for increased mobiility needed to stock shelves.  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 Long Term Goals Time to Complete Long Term Goals: 4 weeks Long Term Goal 1: Patient will return to prior level of independence with all daily, work, and leisure activities. Long Term Goal 2:  Patient will decrease his right shoulder pain to 1/10 when performing work duties.  Long Term Goal 3: Patient will decrease fascial restrictions to min in his right shoulder region for increased mobiility needed to stock shelves.  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  Problem List Patient Active Problem List   Diagnosis Date Noted  . Muscle weakness (generalized) 05/18/2012  . Pain in joint, shoulder region 05/18/2012  . OBESITY 11/21/2007  . WARTS, BOTH HANDS 06/14/2007  . FREQUENCY, URINARY 06/14/2007  . ERECTILE DYSFUNCTION 05/17/2007  . TOBACCO ABUSE 03/16/2007  . ATTENTION DEFICIT DISORDER, ADULT 03/16/2007  . BACK PAIN, LUMBAR 05/14/2006  . ANXIETY DEPRESSION 02/23/2006  . COMMON MIGRAINE 02/23/2006  . 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 SEIZURE DISORDER 02/23/2006    End of Session Activity Tolerance: Patient tolerated treatment well General Behavior During Therapy: WFL for tasks assessed/performed Cognition: WFL for tasks performed OT Plan of Care OT Home Exercise Plan: red theraband exercises OT Patient Instructions: handout Consulted and Agree with Plan of Care: Patient   [name], OTR/L,CBIS   05/18/2012, 4:44 PM  Physician Documentation Your signature is required to indicate approval of the treatment plan as stated above.  Please sign and either send electronically or make a copy of this report for your files and return  this physician signed original.  Please mark one 1.__approve of plan  2. ___approve of plan with the following conditions.   ______________________________                                                          _____________________ Physician Signature                                                                                                             Date

## 2012-05-20 ENCOUNTER — Ambulatory Visit (HOSPITAL_COMMUNITY)
Admission: RE | Admit: 2012-05-20 | Discharge: 2012-05-20 | Disposition: A | Discharge status: Home / Self Care | Payer: Self-pay | Source: Ambulatory Visit | Attending: Orthopaedic Surgery | Admitting: Orthopaedic Surgery

## 2012-05-20 NOTE — Progress Notes (Signed)
Occupational Therapy Treatment Patient Details  Name: Pedro Benson MRN: [id number] Date of Birth: [birth year]  Today's Date: 05/20/2012 Time: 6578-4696 OT Time Calculation (min): 37 min MFR 853-901 8' Therex 901-930 29'  Visit#: 2 of 8  Re-eval: 06/15/12    Authorization: Self pay  Authorization Time Period:    Authorization Visit#:   of    Subjective Symptoms/Limitations Symptoms: S: Yesterday my arm was killing me and today it feels real good so far. Pain Assessment Currently in Pain?: No/denies  Precautions/Restrictions  Precautions Precautions: None  Exercise/Treatments Supine Protraction: PROM;AROM;10 reps Horizontal ABduction: PROM;AROM;10 reps External Rotation: PROM;AROM;10 reps Internal Rotation: PROM;AROM;10 reps Flexion: PROM;AROM;10 reps ABduction: PROM;AROM;10 reps Seated Protraction: PROM;AROM;10 reps Horizontal ABduction: PROM;AROM;10 reps External Rotation: PROM;AROM;10 reps Internal Rotation: PROM;AROM;10 reps Flexion: PROM;AROM;10 reps Abduction: PROM;AROM;10 reps ROM / Strengthening / Isometric Strengthening Thumb Tacks: 1' "W" Arms: 10X X to V Arms: 10X Prot/Ret//Elev/Dep: 1'      Manual Therapy Manual Therapy: Myofascial release Myofascial Release: MFR and manual stretching to upper arm, scapularis, and trapezius region to decrease fascial restrictions and allow for pain free movement.   Occupational Therapy Assessment and Plan OT Assessment and Plan Clinical Impression Statement: A: Tolerated exercises well. Slight muscle fatigue during AROM seated.  Pt will benefit from skilled therapeutic intervention in order to improve on the following deficits: Increased fascial restricitons;Pain;Decreased strength Rehab Potential: Excellent OT Frequency: Min 2X/week OT Duration: 4 weeks OT Treatment/Interventions: Self-care/ADL training;Therapeutic activities;Therapeutic exercise;Modalities;Manual therapy;Patient/family education OT Plan: P:  Add wall wash.    Goals Short Term Goals Time to Complete Short Term Goals: 2 weeks Short Term Goal 1: Patient will be educated on HEP.  Short Term Goal 1 Progress: Progressing toward goal Short Term Goal 2:  Patient will decrease his right shoulder pain to 3/10 when performing work duties.  Short Term Goal 2 Progress: Progressing toward goal Short Term Goal 3: Patient will decrease fascial restrictions to mod in his right shoulder region for increased mobiility needed to stock shelves.  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 Short Term Goal 4 Progress: Progressing toward goal Long Term Goals Time to Complete Long Term Goals: 4 weeks Long Term Goal 1: Patient will return to prior level of independence with all daily, work, and leisure activities. Long Term Goal 1 Progress: Progressing toward goal Long Term Goal 2:  Patient will decrease his right shoulder pain to 1/10 when performing work duties.  Long Term Goal 2 Progress: Progressing toward goal Long Term Goal 3: Patient will decrease fascial restrictions to min in his right shoulder region for increased mobiility needed to stock shelves.  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 Long Term Goal 4 Progress: Progressing toward goal  Problem List Patient Active Problem List   Diagnosis Date Noted  . Muscle weakness (generalized) 05/18/2012  . Pain in joint, shoulder region 05/18/2012  . OBESITY 11/21/2007  . WARTS, BOTH HANDS 06/14/2007  . FREQUENCY, URINARY 06/14/2007  . ERECTILE DYSFUNCTION 05/17/2007  . TOBACCO ABUSE 03/16/2007  . ATTENTION DEFICIT DISORDER, ADULT 03/16/2007  . BACK PAIN, LUMBAR  05/14/2006  . ANXIETY DEPRESSION 02/23/2006  . COMMON MIGRAINE 02/23/2006  . CONGENITAL  HIP DYSPLASIA, RIGHT 02/23/2006  . SEIZURE DISORDER 02/23/2006    End of Session Activity Tolerance: Patient tolerated treatment well General Behavior During Therapy: WFL for tasks assessed/performed Cognition: WFL for tasks performed OT Plan of Care OT Home Exercise Plan: red theraband exercises OT Patient Instructions: handout Consulted and Agree with Plan of Care: Patient   [name], OTR/L,CBIS   05/20/2012, 9:30 AM

## 2012-05-25 ENCOUNTER — Ambulatory Visit (HOSPITAL_COMMUNITY)
Admission: RE | Admit: 2012-05-25 | Discharge: 2012-05-25 | Disposition: A | Discharge status: Home / Self Care | Payer: Self-pay | Source: Ambulatory Visit | Attending: Orthopaedic Surgery | Admitting: Orthopaedic Surgery

## 2012-05-25 NOTE — Progress Notes (Signed)
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 My shoulder is hurting.  Pain Assessment Currently in Pain?: Yes Pain Score:   3 Pain Location: Shoulder Pain Orientation: Right Pain Type: Acute pain  Precautions/Restrictions  Precautions Precautions: None  Exercise/Treatments Supine Protraction: PROM;AROM;10 reps Horizontal ABduction: PROM;AROM;10 reps External Rotation: PROM;AROM;10 reps Internal Rotation: PROM;AROM;10 reps Flexion: PROM;AROM;10 reps ABduction: PROM;AROM;10 reps ROM / Strengthening / Isometric Strengthening Thumb Tacks: 1' Prot/Ret//Elev/Dep: 1'      Manual Therapy Manual Therapy: Myofascial release Myofascial Release: MFR and manual stretching to upper arm, scapularis, and trapezius region to decrease fascial restrictions and allow for pain free movement.   Occupational Therapy Assessment and Plan OT Assessment and Plan Clinical Impression Statement: A: Increased muscle tightness this tx session. Pt educated to use ice and heat at home before going to work tonight. Pt will benefit from skilled therapeutic intervention in order to improve on the following deficits: Increased fascial restricitons;Pain;Decreased strength Rehab Potential: Excellent OT Frequency: Min 2X/week OT Duration: 4 weeks OT Treatment/Interventions: Self-care/ADL training;Therapeutic activities;Therapeutic exercise;Modalities;Manual therapy;Patient/family education OT Plan: P: Add wall wash.    Goals Short Term Goals Time to Complete Short Term Goals: 2 weeks Short Term  Goal 1: Patient will be educated on HEP.  Short Term Goal 1 Progress: Progressing toward goal Short Term Goal 2:  Patient will decrease his right shoulder pain to 3/10 when performing work duties.  Short Term Goal 2 Progress: Progressing toward goal Short Term Goal 3: Patient will decrease fascial restrictions to mod in his right shoulder region for increased mobiility needed to stock shelves.  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 Short Term Goal 4 Progress: Progressing toward goal Long Term Goals Time to Complete Long Term Goals: 4 weeks Long Term Goal 1: Patient will return to prior level of independence with all daily, work, and leisure activities. Long Term Goal 1 Progress: Progressing toward goal Long Term Goal 2:  Patient will decrease his right shoulder pain to 1/10 when performing work duties.  Long Term Goal 2 Progress: Progressing toward goal Long Term Goal 3: Patient will decrease fascial restrictions to min in his right shoulder region for increased mobiility needed to stock shelves.  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 Long Term Goal 4 Progress: Progressing toward goal  Problem List Patient Active Problem List   Diagnosis Date Noted  . Muscle weakness (generalized) 05/18/2012  . Pain in joint, shoulder region 05/18/2012  . OBESITY 11/21/2007  . WARTS, BOTH HANDS 06/14/2007  . FREQUENCY, URINARY 06/14/2007  . ERECTILE DYSFUNCTION 05/17/2007  . TOBACCO ABUSE 03/16/2007  . ATTENTION DEFICIT DISORDER, ADULT 03/16/2007  . BACK PAIN, LUMBAR 05/14/2006  . ANXIETY DEPRESSION 02/23/2006  . COMMON MIGRAINE 02/23/2006  . CONGENITAL HIP DYSPLASIA,  RIGHT 02/23/2006  . SEIZURE DISORDER 02/23/2006    End of Session Activity Tolerance: Patient tolerated treatment well General Behavior During Therapy: [facility] for tasks assessed/performed Cognition: [facility] for tasks performed   [name], OTR/L,CBIS   05/25/2012, 9:30 AM

## 2012-05-27 ENCOUNTER — Ambulatory Visit (HOSPITAL_COMMUNITY)
Admission: RE | Admit: 2012-05-27 | Discharge: 2012-05-27 | Disposition: A | Discharge status: Home / Self Care | Payer: Self-pay | Source: Ambulatory Visit | Attending: Internal Medicine | Admitting: Internal Medicine

## 2012-05-27 NOTE — Progress Notes (Signed)
Occupational Therapy Treatment Patient Details  Name: Pedro Benson MRN: [id number] Date of Birth: October 28, 1965  Today's Date: 05/27/2012 Time: 4098-1191 OT Time Calculation (min): 49 min MFR 857-913 16' Therex 478-295 33'  Visit#: 4 of 8  Re-eval: 06/15/12    Authorization: Self pay  Authorization Time Period:    Authorization Visit#:   of    Subjective Symptoms/Limitations Symptoms: S: My shoulder has been killing me lately. I didn't work last night though.  Pain Assessment Currently in Pain?: Yes Pain Score:   4 Pain Location: Shoulder Pain Orientation: Right Pain Type: Acute pain  Precautions/Restrictions  Precautions Precautions: None  Exercise/Treatments Supine Protraction: PROM;Strengthening;Weights;15 reps Protraction Weight (lbs): 1 Horizontal ABduction: PROM;Strengthening;Weights;15 reps Horizontal ABduction Weight (lbs): 1 External Rotation: PROM;Strengthening;Weights;15 reps External Rotation Weight (lbs): 1 Internal Rotation: PROM;Strengthening;Weights;15 reps Internal Rotation Weight (lbs): 1 Flexion: PROM;Weights;15 reps Shoulder Flexion Weight (lbs): 1 ABduction: PROM;Strengthening;15 reps;Weights Shoulder ABduction Weight (lbs): 1 Seated Protraction: Strengthening;12 reps;Weights Protraction Weight (lbs): 1 Horizontal ABduction: Strengthening;12 reps;Weights Horizontal ABduction Weight (lbs): 1 External Rotation: Strengthening;12 reps;Weights External Rotation Weight (lbs): 1 Internal Rotation: Strengthening;12 reps;Weights Internal Rotation Weight (lbs): 1 Flexion: Strengthening;12 reps;Weights Flexion Weight (lbs): 1 Abduction: Strengthening;12 reps;Weights ABduction Weight (lbs): 1 ROM / Strengthening / Isometric Strengthening UBE (Upper Arm Bike): 4' reverse only Wall Wash: 1' Thumb Tacks: 1' "W" Arms: 10X with 1# X to V Arms: 10X with 1# Ball on Wall: 1' flexion/ABD green ball  Prot/Ret//Elev/Dep: d/c      Manual  Therapy Manual Therapy: Myofascial release Myofascial Release: MFR and manual stretching to upper arm, scapularis, and trapezius region to decrease fascial restrictions and allow for pain free movement.  Occupational Therapy Assessment and Plan OT Assessment and Plan Clinical Impression Statement: A: Added wall wash, 1# weight supine and seated, and UBE bike. Tolerated well. Applied Bio[employer]ze to right upper arm after manual stretching and gave patient free samples.  OT Plan: P: Use 2# handweight if able to tolerate. Add theraband.   Goals Short Term Goals Time to Complete Short Term Goals: 2 weeks Short Term Goal 1: Patient will be educated on HEP.  Short Term Goal 2:  Patient will decrease his right shoulder pain to 3/10 when performing work duties.  Short Term Goal 3: Patient will decrease fascial restrictions to mod in his right shoulder region for increased mobiility needed to stock shelves.  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 Long Term Goals Time to Complete Long Term Goals: 4 weeks Long Term Goal 1: Patient will return to prior level of independence with all daily, work, and leisure activities. Long Term Goal 2:  Patient will decrease his right shoulder pain to 1/10 when performing work duties.  Long Term Goal 3: Patient will decrease fascial restrictions to min in his right shoulder region for increased mobiility needed to stock shelves.  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  Problem List Patient Active Problem List   Diagnosis Date Noted  . Muscle weakness (generalized) 05/18/2012  . Pain in joint, shoulder region 05/18/2012  . OBESITY 11/21/2007  . WARTS, BOTH HANDS 06/14/2007  . FREQUENCY, URINARY 06/14/2007  . ERECTILE DYSFUNCTION 05/17/2007   . TOBACCO ABUSE 03/16/2007  . ATTENTION DEFICIT DISORDER, ADULT 03/16/2007  . BACK PAIN, LUMBAR 05/14/2006  .  ANXIETY DEPRESSION 02/23/2006  . COMMON MIGRAINE 02/23/2006  . 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 SEIZURE DISORDER 02/23/2006    End of Session Activity Tolerance: Patient tolerated treatment well General Behavior During Therapy: [facility] for tasks assessed/performed Cognition: [facility] for tasks performed   [name], OTR/L,CBIS   05/27/2012, 9:58 AM

## 2012-05-31 ENCOUNTER — Ambulatory Visit (HOSPITAL_COMMUNITY)
Admission: RE | Admit: 2012-05-31 | Discharge: 2012-05-31 | Disposition: A | Discharge status: Home / Self Care | Payer: Self-pay | Source: Ambulatory Visit | Attending: Internal Medicine | Admitting: Internal Medicine

## 2012-05-31 NOTE — Progress Notes (Signed)
Occupational Therapy Treatment Patient Details  Name: Pedro Benson MRN: [id number] Date of Birth: [birth year]  Today's Date: 05/31/2012 Time: 4098-1191 OT Time Calculation (min): 36 min Manual therapy 4782-9562 10' Therapeutic exercises 1308-6578 26' Visit#: 5 of 8  Re-eval: 06/15/12 (reassess on 06/03/12 for MD visit same day)    Authorization: Self pay  Authorization Time Period:    Authorization Visit#:   of    Subjective Symptoms/Limitations Symptoms: S:  I have sunburn on my shoulders, that is hurting (MFR). Pain Assessment Currently in Pain?: Yes Pain Score:   5 Pain Location: Shoulder Pain Orientation: Right Pain Type: Acute pain  Precautions/Restrictions     Exercise/Treatments Supine Protraction: PROM;Strengthening;10 reps Protraction Weight (lbs): 2 Horizontal ABduction: PROM;Strengthening;10 reps Horizontal ABduction Weight (lbs): 2 External Rotation: PROM;Strengthening;10 reps External Rotation Weight (lbs): 2 Internal Rotation: PROM;Strengthening;10 reps Internal Rotation Weight (lbs): 2 Flexion: PROM;Strengthening;10 reps Shoulder Flexion Weight (lbs): 2 ABduction: PROM;Strengthening;10 reps Shoulder ABduction Weight (lbs): 2 Seated Protraction: Strengthening;10 reps Protraction Weight (lbs): 2 Horizontal ABduction: Strengthening;10 reps Horizontal ABduction Weight (lbs): 2 External Rotation: Strengthening;10 reps External Rotation Weight (lbs): 2 Internal Rotation: Strengthening;10 reps Internal Rotation Weight (lbs): 2 Flexion: Strengthening;10 reps Flexion Weight (lbs): 2 Abduction: Strengthening;10 reps ABduction Weight (lbs): 2 Standing External Rotation: Theraband;10 reps Theraband Level (Shoulder External Rotation): Level 3 (Green) Internal Rotation: Theraband;10 reps Theraband Level (Shoulder Internal Rotation): Level 3 (Green) Extension: Theraband;10 reps Theraband Level (Shoulder Extension): Level 3 (Green) Row: Theraband;10  reps Theraband Level (Shoulder Row): Level 3 (Green) Retraction: Theraband;10 reps Theraband Level (Shoulder Retraction): Level 3 (Green) ROM / Strengthening / Isometric Strengthening UBE (Upper Arm Bike): 3' amd 3' 1.5 resistance "W" Arms: 10X with 2# X to V Arms: 10X with 2# Proximal Shoulder Strengthening, Seated: 10 times each exercise, without resting between exercises Ball on Wall: 1' flexion and 1' abduction at 90 degrees       Manual Therapy Manual Therapy: Myofascial release Myofascial Release: MFR and manual stretching to upper arm, scapularis, and trapezius region to decrease fascial restrictions and allow for pain free movement.  Occupational Therapy Assessment and Plan OT Assessment and Plan Clinical Impression Statement: A:  Increased to 2# resistance with supine and seated strengthening, as patient completing 1 pound with out difficulty.  Added tband for scapular stability and strengthening. OT Plan: P:  Reassess for md visit on friday.  Increase repetitions and resistance with scapular stability exercises and add cybex press and row for increased shoulder alignment, allowing for less pressure on supraspinatus tendon.    Goals Short Term Goals Time to Complete Short Term Goals: 2 weeks Short Term Goal 1: Patient will be educated on HEP.  Short Term Goal 1 Progress: Progressing toward goal Short Term Goal 2:  Patient will decrease his right shoulder pain to 3/10 when performing work duties.  Short Term Goal 2 Progress: Progressing toward goal Short Term Goal 3: Patient will decrease fascial restrictions to mod in his right shoulder region for increased mobiility needed to stock shelves.  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 Short Term Goal 4 Progress: Progressing toward goal Long Term Goals Time to  Complete Long Term Goals: 4 weeks Long Term Goal 1: Patient will return to prior level of independence with all daily, work, and leisure activities. Long Term Goal 1 Progress: Progressing toward goal Long  Term Goal 2:  Patient will decrease his right shoulder pain to 1/10 when performing work duties.  Long Term Goal 2 Progress: Progressing toward goal Long Term Goal 3: Patient will decrease fascial restrictions to min in his right shoulder region for increased mobiility needed to stock shelves.  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 Long Term Goal 4 Progress: Progressing toward goal  Problem List Patient Active Problem List   Diagnosis Date Noted  . Muscle weakness (generalized) 05/18/2012  . Pain in joint, shoulder region 05/18/2012  . OBESITY 11/21/2007  . WARTS, BOTH HANDS 06/14/2007  . FREQUENCY, URINARY 06/14/2007  . ERECTILE DYSFUNCTION 05/17/2007  . TOBACCO ABUSE 03/16/2007  . ATTENTION DEFICIT DISORDER, ADULT 03/16/2007  . BACK PAIN, LUMBAR 05/14/2006  . ANXIETY DEPRESSION 02/23/2006  . COMMON MIGRAINE 02/23/2006  . 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 SEIZURE DISORDER 02/23/2006    End of Session Activity Tolerance: Patient tolerated treatment well General Behavior During Therapy: [facility] for tasks assessed/performed Cognition: WFL for tasks performed  GO    Shirlean Mylar, OTR/L  05/31/2012, 1:44 PM

## 2012-06-03 ENCOUNTER — Ambulatory Visit (HOSPITAL_COMMUNITY): Payer: Self-pay | Admitting: Specialist

## 2012-06-06 ENCOUNTER — Ambulatory Visit (HOSPITAL_COMMUNITY)
Admission: RE | Admit: 2012-06-06 | Discharge: 2012-06-06 | Disposition: A | Discharge status: Home / Self Care | Payer: Self-pay | Source: Ambulatory Visit | Attending: Orthopaedic Surgery | Admitting: Orthopaedic Surgery

## 2012-06-06 DIAGNOSIS — M25519 Pain in unspecified shoulder: Secondary | ICD-10-CM | POA: Insufficient documentation

## 2012-06-06 DIAGNOSIS — M6281 Muscle weakness (generalized): Secondary | ICD-10-CM | POA: Insufficient documentation

## 2012-06-06 DIAGNOSIS — IMO0001 Reserved for inherently not codable concepts without codable children: Secondary | ICD-10-CM | POA: Insufficient documentation

## 2012-06-06 NOTE — Progress Notes (Signed)
Occupational Therapy Treatment Patient Details  Name: Pedro Benson MRN: [id number] Date of Birth: [birth year]  Today's Date: 06/06/2012 Time: 4132-4401 OT Time Calculation (min): 38 min Manual Therapy 0272-5366 8' [place] exercises 4403-4742 30' Visit#: 6 of 8  Re-eval: 06/15/12    Authorization: Self pay  Authorization Time Period:    Authorization Visit#:   of     S:  It doesnt feel bad at all because I havent done anything in 4 days.  I did do my exercises. Pain Assessment Currently in Pain?: No/denies Pain Score: 0-No pain  Precautions/Restrictions   n/a  Exercise/Treatments Supine Protraction: PROM;Strengthening;10 reps Protraction Weight (lbs): 3 Horizontal ABduction: PROM;Strengthening;10 reps Horizontal ABduction Weight (lbs): 3 External Rotation: PROM;Strengthening;10 reps External Rotation Weight (lbs): 3 Internal Rotation: PROM;Strengthening;10 reps Internal Rotation Weight (lbs): 3 Flexion: PROM;Strengthening;10 reps Shoulder Flexion Weight (lbs): 3 ABduction: PROM;Strengthening;10 reps Shoulder ABduction Weight (lbs): 3 Seated Protraction: Strengthening;10 reps Protraction Weight (lbs): 3 Horizontal ABduction: Strengthening;10 reps Horizontal ABduction Weight (lbs): 3 External Rotation: Strengthening;10 reps External Rotation Weight (lbs): 3 Internal Rotation: Strengthening;10 reps Internal Rotation Weight (lbs): 3 Flexion: Strengthening;10 reps Flexion Weight (lbs): 3 Abduction: Strengthening;10 reps ABduction Weight (lbs): 3 ROM / Strengthening / Isometric Strengthening UBE (Upper Arm Bike): 3' and 3' 3.0 Cybex Press: 2 plate;15 reps Cybex Row: 2 plate;15 reps Wall Wash: 2' with 3# "W" Arms: 10X with 3# X to V Arms: 10X with 3# Proximal Shoulder Strengthening, Seated: 10 times each exercise, without resting between exercises 3#   Body Blade Flexion: 15 seconds;1 rep ABduction: 15 seconds;1 rep External Rotation: 15 seconds;1 rep     Manual Therapy Manual Therapy: Myofascial release Myofascial Release: MFR and manual stretching to upper arm, scapularis, and trapezius region to decrease fascial restrictions and allow for pain free movement.  Occupational Therapy Assessment and Plan OT Assessment and Plan Clinical Impression Statement: A:  increased to 3# resistance with strengthening, trace fascial restrictions noted during manual therapy this date.  Added bodyblade activity for additional proximal shoulder strengthening.  OT Plan: P:  Follow up on pain level after returning to work s/p 4 days off, increase repetitions with strengthening exercises.    Goals Short Term Goals Time to Complete Short Term Goals: 2 weeks Short Term Goal 1: Patient will be educated on HEP.  Short Term Goal 2:  Patient will decrease his right shoulder pain to 3/10 when performing work duties.  Short Term Goal 2 Progress: Progressing toward goal Short Term Goal 3: Patient will decrease fascial restrictions to mod in his right shoulder region for increased mobiility needed to stock shelves.  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 Short Term Goal 4 Progress: Progressing toward goal Long Term Goals Time to Complete Long Term Goals: 4 weeks Long Term Goal 1: Patient will return to prior level of independence with all daily, work, and leisure activities. Long Term Goal 1 Progress: Progressing toward goal Long Term Goal 2:  Patient will decrease his right shoulder pain to 1/10 when performing work duties.  Long Term Goal 2 Progress: Progressing toward goal Long Term Goal 3: Patient will decrease fascial restrictions to min in his right shoulder region for increased mobiility needed to stock shelves.  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 Long Term Goal 4 Progress: Progressing toward goal  Problem List Patient Active Problem List   Diagnosis Date Noted  . Muscle weakness (generalized) 05/18/2012  . Pain in joint, shoulder region 05/18/2012  . OBESITY 11/21/2007  . WARTS, BOTH HANDS 06/14/2007  . FREQUENCY, URINARY 06/14/2007  . ERECTILE DYSFUNCTION 05/17/2007  . TOBACCO ABUSE 03/16/2007  . ATTENTION DEFICIT DISORDER, ADULT 03/16/2007  . BACK PAIN, LUMBAR 05/14/2006  . ANXIETY DEPRESSION 02/23/2006  . COMMON MIGRAINE 02/23/2006  . 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 SEIZURE DISORDER 02/23/2006    End of Session Activity Tolerance: Patient tolerated treatment well General Behavior During Therapy: [facility] for tasks assessed/performed Cognition: WFL for tasks performed  GO Shirlean Mylar, OTR/L    06/06/2012, 11:45 AM

## 2012-06-08 ENCOUNTER — Ambulatory Visit (HOSPITAL_COMMUNITY)
Admission: RE | Admit: 2012-06-08 | Discharge: 2012-06-08 | Disposition: A | Discharge status: Home / Self Care | Payer: Self-pay | Source: Ambulatory Visit | Attending: Internal Medicine | Admitting: Internal Medicine

## 2012-06-08 NOTE — Progress Notes (Signed)
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  Pain Assessment Currently in Pain?: Yes Pain Score:   5 Pain Location: Shoulder Pain Orientation: Right Pain Type: Acute pain  Precautions/Restrictions  Precautions Precautions: None  Exercise/Treatments Supine Protraction: PROM;Strengthening;12 reps Protraction Weight (lbs): 3 Horizontal ABduction: PROM;Strengthening;12 reps Horizontal ABduction Weight (lbs): 3 External Rotation: PROM;Strengthening;12 reps External Rotation Weight (lbs): 3 Internal Rotation: PROM;Strengthening;12 reps Internal Rotation Weight (lbs): 3 Flexion: PROM;Strengthening;12 reps Shoulder Flexion Weight (lbs): 3 ABduction: PROM;Strengthening;12 reps Shoulder ABduction Weight (lbs): 3 Seated Protraction: Strengthening;12 reps Protraction Weight (lbs): 3 Horizontal ABduction: Strengthening;12 reps Horizontal ABduction Weight (lbs): 3 External Rotation: Strengthening;12 reps External Rotation Weight (lbs): 3 Internal Rotation: Strengthening;12 reps Internal Rotation Weight (lbs): 3 Flexion: Strengthening;12 reps Flexion Weight (lbs): 3 Abduction: Strengthening;12 reps ABduction Weight (lbs): 3ROM / Strengthening / Isometric Strengthening UBE (Upper Arm Bike): 3' and 3' 3.0 Cybex Press: 2 plate;15 reps Cybex Row: 2 plate;15 reps "W" Arms: 10X with 3# X to V Arms: 10X with 3#       Manual Therapy Manual Therapy: Myofascial release Myofascial Release: MFR and manual stretching to upper arm, scapularis, and trapezius region to decrease  fascial restrictions and allow for pain free movement.  Occupational Therapy Assessment and Plan OT Assessment and Plan Clinical Impression Statement: A: Increased repetitions with strengthening exercises. Worked on good posture when completing exercises seated.  OT Plan: P: Increase repetitions with strengthening exercises. REASSESS.   Goals Short Term Goals Time to Complete Short Term Goals: 2 weeks Short Term Goal 1: Patient will be educated on HEP.  Short Term Goal 2:  Patient will decrease his right shoulder pain to 3/10 when performing work duties.  Short Term Goal 3: Patient will decrease fascial restrictions to mod in his right shoulder region for increased mobiility needed to stock shelves.  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 Long Term Goals Time to Complete Long Term Goals: 4 weeks Long Term Goal 1: Patient will return to prior level of independence with all daily, work, and leisure activities. Long Term Goal 2:  Patient will decrease his right shoulder pain to 1/10 when performing work duties.  Long Term Goal 3: Patient will decrease fascial restrictions to min in his right shoulder region for increased mobiility needed to stock shelves.  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  Problem List Patient Active Problem List   Diagnosis Date Noted  . Muscle weakness (generalized) 05/18/2012  . Pain in joint, shoulder region 05/18/2012  . OBESITY 11/21/2007  . WARTS, BOTH HANDS 06/14/2007  . FREQUENCY, URINARY 06/14/2007  . ERECTILE DYSFUNCTION 05/17/2007  . TOBACCO ABUSE 03/16/2007  . ATTENTION DEFICIT DISORDER, ADULT 03/16/2007  . BACK PAIN, LUMBAR 05/14/2006  . ANXIETY DEPRESSION 02/23/2006  . COMMON MIGRAINE 02/23/2006  . CONGENITAL HIP  DYSPLASIA, RIGHT 02/23/2006  . SEIZURE DISORDER 02/23/2006  End of Session Activity Tolerance: Patient tolerated treatment well General Behavior During Therapy: [facility] for tasks assessed/performed Cognition: WFL for tasks performed   [name], OTR/L,CBIS   06/08/2012, 11:54 AM

## 2012-06-13 ENCOUNTER — Ambulatory Visit (HOSPITAL_COMMUNITY)
Admission: RE | Admit: 2012-06-13 | Discharge: 2012-06-13 | Disposition: A | Discharge status: Home / Self Care | Payer: Self-pay | Source: Ambulatory Visit | Attending: Specialist | Admitting: Specialist

## 2012-06-13 NOTE — Progress Notes (Signed)
Occupational Therapy Treatment Patient Details  Name: HRIDHAAN YOHN MRN: [id number] Date of Birth: [birth year]  Today's Date: 06/13/2012 Time: 1308-6578 OT Time Calculation (min): 23 min Manual Therapy 1525-1535 10' ROM/MMT 4696-2952  Visit#: 8 of 8       Authorization: Self pay   Subjective S:  It hasnt bothered me today because I have been sleeping. Pain Assessment Currently in Pain?: Yes Pain Score:   2 Pain Location: Shoulder Pain Orientation: Right Pain Type: Acute pain  Precautions/Restrictions   n/a  Exercise/Treatments    Manual Therapy Manual Therapy: Myofascial release Myofascial Release: MFR and manual stretching to upper arm, scapularis, and trapezius region to decrease fascial restrictions and allow for pain free movement.  Occupational Therapy Assessment and Plan OT Assessment and Plan Clinical Impression Statement: A: Patient reassessed today current (initial evaluation) seated AROM and strength: flexion 175 5/5 (WFL 4-/5), abduction 182 5/5 (WFL 4-/5), external rotation with shoulder abducted 85 5/5 (WFL 5/5), internal rotation with shoulder abducted 80 5/5 (WFL 5/5).  Patient states that he only takes one day to recover from a strenuous day of work vs previously taking 3 days.  Pain level is 2/10 and as low as 1/10 at work.  He has met all short term and long term goals.   OT Plan: P:  DC from skilled OT intervention this date, as patient has met all OT goals and is pleased with his progress.   Goals Short Term Goals Time to Complete Short Term Goals: 2 weeks Short Term Goal 1: Patient will be educated on HEP.  Short Term Goal 1 Progress: Met Short Term Goal 2:  Patient will decrease his right shoulder pain to 3/10 when performing work duties.  Short Term Goal 2 Progress: Met Short Term Goal 3: Patient will decrease fascial restrictions to mod in his right shoulder region for increased mobiility needed to stock shelves.  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 Short Term Goal 4 Progress: Met Long Term Goals Time to Complete Long Term Goals: 4 weeks Long Term Goal 1: Patient will return to prior level of independence with all daily, work, and leisure activities. Long Term Goal 1 Progress: Met Long Term Goal 2:  Patient will decrease his right shoulder pain to 1/10 when performing work duties.  Long Term Goal 2 Progress: Met Long Term Goal 3: Patient will decrease fascial restrictions to min in his right shoulder region for increased mobiility needed to stock shelves.  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 Long Term Goal 4 Progress: Met  Problem List Patient Active Problem List   Diagnosis Date Noted  . Muscle weakness (generalized) 05/18/2012  . Pain in joint, shoulder region 05/18/2012  . OBESITY 11/21/2007  . WARTS, BOTH HANDS 06/14/2007  . FREQUENCY, URINARY 06/14/2007  . ERECTILE DYSFUNCTION 05/17/2007  . TOBACCO ABUSE 03/16/2007  . ATTENTION DEFICIT DISORDER, ADULT 03/16/2007  . BACK PAIN, LUMBAR 05/14/2006  . ANXIETY DEPRESSION 02/23/2006  . COMMON MIGRAINE 02/23/2006  . 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 SEIZURE DISORDER 02/23/2006    End of Session Activity Tolerance: Patient tolerated treatment well General Behavior During Therapy: [facility] for tasks assessed/performed Cognition: WFL for tasks  performed OT Plan of Care OT Home Exercise Plan: red theraband exercises Consulted and Agree with Plan of Care: Patient  GO    Shirlean Mylar, OTR/L  06/13/2012, 3:52 PM

## 2012-06-17 ENCOUNTER — Ambulatory Visit (HOSPITAL_COMMUNITY): Payer: Self-pay

## 2012-06-20 ENCOUNTER — Ambulatory Visit (HOSPITAL_COMMUNITY): Payer: Self-pay | Admitting: Specialist

## 2012-06-24 ENCOUNTER — Ambulatory Visit (HOSPITAL_COMMUNITY): Payer: Self-pay

## 2012-06-27 ENCOUNTER — Ambulatory Visit (HOSPITAL_COMMUNITY): Payer: Self-pay | Admitting: Specialist

## 2012-07-01 ENCOUNTER — Ambulatory Visit (HOSPITAL_COMMUNITY): Payer: Self-pay

## 2012-10-31 ENCOUNTER — Emergency Department (HOSPITAL_COMMUNITY)
Admission: EM | Admit: 2012-10-31 | Discharge: 2012-11-01 | Disposition: A | Discharge status: Home / Self Care | Payer: Self-pay | Attending: Emergency Medicine | Admitting: Emergency Medicine

## 2012-10-31 ENCOUNTER — Encounter (HOSPITAL_COMMUNITY): Payer: Self-pay | Admitting: Emergency Medicine

## 2012-10-31 DIAGNOSIS — G40909 Epilepsy, unspecified, not intractable, without status epilepticus: Secondary | ICD-10-CM | POA: Insufficient documentation

## 2012-10-31 DIAGNOSIS — M25511 Pain in right shoulder: Secondary | ICD-10-CM

## 2012-10-31 DIAGNOSIS — F411 Generalized anxiety disorder: Secondary | ICD-10-CM | POA: Insufficient documentation

## 2012-10-31 DIAGNOSIS — G8929 Other chronic pain: Secondary | ICD-10-CM | POA: Insufficient documentation

## 2012-10-31 DIAGNOSIS — M25519 Pain in unspecified shoulder: Secondary | ICD-10-CM | POA: Insufficient documentation

## 2012-10-31 DIAGNOSIS — F17200 Nicotine dependence, unspecified, uncomplicated: Secondary | ICD-10-CM | POA: Insufficient documentation

## 2012-10-31 DIAGNOSIS — Z79899 Other long term (current) drug therapy: Secondary | ICD-10-CM | POA: Insufficient documentation

## 2012-10-31 HISTORY — DX: Other chronic pain: G89.29

## 2012-10-31 HISTORY — DX: Pain in unspecified shoulder: M25.519

## 2012-10-31 NOTE — ED Provider Notes (Signed)
CSN: [id number]     Arrival date & time 10/31/12  2324 History   First MD Initiated Contact with Patient 10/31/12 2348     Chief Complaint  Patient presents with  . Shoulder Pain   (Consider location/radiation/quality/duration/timing/severity/associated sxs/prior Treatment) Patient is a 47 y.o. male presenting with shoulder pain. The history is provided by the patient.  Shoulder Pain This is a chronic problem. Pertinent negatives include no abdominal pain, fever or vomiting.   Pedro Benson is a 47 y.o. male who presents to the ED with right shoulder pain. The pain has been ongoing for the past year. He has had an MRI that showed a partial tear of the rotator cuff. He has been to Dr. Hilda Lias and had the area injected. He does not have insurance so can not afford surgery. He has been to Dr. Margo Aye and he told the patient he would help him get some help. That has been 3 weeks and he has not heard back. Tonight the pain in the right shoulder is much worse. Patient requesting pain medication for tonight until he can see Dr. Margo Aye tomorrow.  Past Medical History  Diagnosis Date  . Seizures   . Borderline personality disorder   . Chronic shoulder pain     right   History reviewed. No pertinent past surgical history. No family history on file. History  Substance Use Topics  . Smoking status: Current Every Day Smoker  . Smokeless tobacco: Not on file  . Alcohol Use: Yes    Review of Systems  Constitutional: Negative for fever.  HENT: Negative.   Respiratory: Negative for shortness of breath.   Gastrointestinal: Negative for vomiting and abdominal pain.  Musculoskeletal:       Right shoulder pain  Skin: Negative for wound.  Neurological: Seizures: history of seizures.  Psychiatric/Behavioral: Nervous/anxious: borderline personality disorder.     Allergies  Grapefruit flavor  Home Medications   Current Outpatient Rx  Name  Route  Sig  Dispense  Refill  .  butalbital-aspirin-caffeine (FIORINAL) 50-325-40 MG per capsule   Oral   Take 1 capsule by mouth 2 (two) times daily as needed for headache.         . carbamazepine (TEGRETOL XR) 400 MG 12 hr tablet   Oral   Take 600 mg by mouth 2 (two) times daily.         [name] HYDROcodone-acetaminophen (NORCO) 7.5-325 MG per tablet   Oral   Take 1 tablet by mouth every 6 (six) hours as needed for pain.         . naproxen (NAPROSYN) 500 MG tablet   Oral   Take 500 mg by mouth 2 (two) times daily with a meal.         . PHENobarbital (LUMINAL) 97.2 MG tablet   Oral   Take 200 mg by mouth.          BP 146/103  Pulse 86  Temp(Src) 97.7 F (36.5 C) (Oral)  Resp 18  Ht 5\' 8"  (1.727 m)  Wt 184 lb (83.462 kg)  BMI 27.98 kg/m2  SpO2 100% Physical Exam  Nursing note and vitals reviewed. Constitutional: He is oriented to person, place, and time. He appears well-developed and well-nourished. No distress.  HENT:  Head: Normocephalic and atraumatic.  Eyes: Conjunctivae and EOM are normal.  Neck: Normal range of motion. Neck supple.  Cardiovascular: Normal rate.   Pulmonary/Chest: Effort normal.  Musculoskeletal:       Right shoulder: He exhibits  tenderness and spasm. He exhibits normal range of motion, no deformity, normal pulse and normal strength.       Arms: Radial pulses equal, adequate circulation, good touch sensation. Good grips and equal bilateral.  Neurological: He is alert and oriented to person, place, and time. No cranial nerve deficit.  Skin: Skin is warm and dry.  Psychiatric: His behavior is normal. His mood appears anxious.    ED Course  Procedures  MDM  47 y.o. male with right shoulder pain. Pain increases with raising arm over head. Will treat pain tonight and he will follow up with Dr. Margo Aye in the morning. Discussed elevated blood pressure and he will have Dr. Margo Aye recheck tomorrow. Patient stable for discharge home without any immediate complications. He remains  neurovascularly intact.     [facility] Orlene Och, [place] 11/01/12 [phone]

## 2012-10-31 NOTE — ED Notes (Signed)
Pt states he has a known right rotator cuff tear, has seen dr Hilda Lias for same and probably needs surgery but cannot afford the surgery.  Pt states recently it is hurting more.

## 2012-11-01 MED ORDER — OXYCODONE-ACETAMINOPHEN 5-325 MG PO TABS: 2.0000 | ORAL_TABLET | ORAL | Status: AC | PRN

## 2012-11-01 NOTE — ED Provider Notes (Signed)
Medical screening examination/treatment/procedure(s) were performed by non-physician practitioner and as supervising physician I was immediately available for consultation/collaboration.  EKG Interpretation   None         Rjay Revolorio M Tysheem Accardo, DO 11/01/12 0704 

## 2012-11-07 MED FILL — Oxycodone w/ Acetaminophen Tab 5-325 MG: ORAL | Qty: 6 | Status: AC

## 2014-03-05 ENCOUNTER — Emergency Department (HOSPITAL_COMMUNITY)
Admission: EM | Admit: 2014-03-05 | Discharge: 2014-03-06 | Disposition: A | Discharge status: Home / Self Care | Payer: Self-pay | Attending: Emergency Medicine | Admitting: Emergency Medicine

## 2014-03-05 ENCOUNTER — Encounter (HOSPITAL_COMMUNITY): Payer: Self-pay

## 2014-03-05 DIAGNOSIS — Z79899 Other long term (current) drug therapy: Secondary | ICD-10-CM | POA: Insufficient documentation

## 2014-03-05 DIAGNOSIS — R569 Unspecified convulsions: Secondary | ICD-10-CM | POA: Insufficient documentation

## 2014-03-05 DIAGNOSIS — G8929 Other chronic pain: Secondary | ICD-10-CM | POA: Insufficient documentation

## 2014-03-05 DIAGNOSIS — Z791 Long term (current) use of non-steroidal anti-inflammatories (NSAID): Secondary | ICD-10-CM | POA: Insufficient documentation

## 2014-03-05 DIAGNOSIS — Z87891 Personal history of nicotine dependence: Secondary | ICD-10-CM | POA: Insufficient documentation

## 2014-03-05 NOTE — ED Provider Notes (Signed)
CSN: [id number]     Arrival date & time 03/05/14  2351 History  This chart was scribed for Dione Booze, MD by Haywood Pao, ED Scribe. The patient was seen in [facility] and the patient's care was started at 12:16 AM.  Chief Complaint  Patient presents with  . Seizures   Patient is a 49 y.o. male presenting with seizures. The history is provided by the patient. No language interpreter was used.  Seizures   HPI Comments: Pedro Benson is a 49 y.o. male with a history of seizures who presents to the Emergency Department complaining of a seizure described as an hour long aura. Pt states the aura felt like deja vu, he also felt very cold and had loss of speech along with shaking that lasted for 1.5hrs. 6 months had auras and Dr. Margo Aye changed his medication amount and his auras became intermittent. His brother advised him to go to a specialist in [place] however no work up was done. He has taken tegretol, valium and phenobarbital for relief. He denies any current pain, pt did not bite down on his tongue or lips and no urine/bowel incontinence.   Past Medical History  Diagnosis Date  . Seizures   . Borderline personality disorder   . Chronic shoulder pain     right   History reviewed. No pertinent past surgical history. No family history on file. History  Substance Use Topics  . Smoking status: Former Games developer  . Smokeless tobacco: Not on file  . Alcohol Use: Yes    Review of Systems  Neurological: Positive for tremors and seizures.  All other systems reviewed and are negative.   Allergies  Grapefruit flavor  Home Medications   Prior to Admission medications   Medication Sig Start Date End Date Taking? Authorizing Provider  butalbital-aspirin-caffeine [facility]) 50-325-40 MG per capsule Take 1 capsule by mouth 2 (two) times daily as needed for headache.    Historical Provider, MD  carbamazepine (TEGRETOL XR) 400 MG 12 hr tablet Take 600 mg by mouth 2 (two) times daily.     Historical Provider, MD  HYDROcodone-acetaminophen (NORCO) 7.5-325 MG per tablet Take 1 tablet by mouth every 6 (six) hours as needed for pain.    Historical Provider, MD  naproxen (NAPROSYN) 500 MG tablet Take 500 mg by mouth 2 (two) times daily with a meal.    Historical Provider, MD  oxyCODONE-acetaminophen (PERCOCET/ROXICET) 5-325 MG per tablet Take 2 tablets by mouth every 4 (four) hours as needed for pain. 11/01/12   Hope Orlene Och, NP  PHENobarbital (LUMINAL) 97.2 MG tablet Take 200 mg by mouth.    Historical Provider, MD   BP 171/101 mmHg  Pulse 97  Temp(Src) 97.8 F (36.6 C) (Oral)  Resp 20  Ht  (1.727 m)  Wt 184 lb (83.462 kg)  BMI 27.98 kg/m2  SpO2 95% Physical Exam  Constitutional: He is oriented to person, place, and time. He appears well-developed and well-nourished. No distress.  HENT:  Head: Normocephalic and atraumatic.  Eyes: Conjunctivae and EOM are normal. Pupils are equal, round, and reactive to light. No scleral icterus.  Fundi are normal  Neck: Normal range of motion. Neck supple. No JVD present.  Cardiovascular: Normal rate, regular rhythm and normal heart sounds.   No murmur heard. Pulmonary/Chest: Effort normal and breath sounds normal. He has no wheezes. He has no rales. He exhibits no tenderness.  Abdominal: Soft. Bowel sounds are normal. He exhibits no distension and no mass. There  is no tenderness.  Musculoskeletal: Normal range of motion. He exhibits no edema.  Lymphadenopathy:    He has no cervical adenopathy.  Neurological: He is alert and oriented to person, place, and time. He displays normal reflexes. No cranial nerve deficit. He exhibits normal muscle tone. Coordination normal.  Speech is halting and slightly slower than normal.  Skin: Skin is warm and dry. No rash noted.  Psychiatric: He has a normal mood and affect. His behavior is normal. Judgment and thought content normal.  Nursing note and vitals reviewed.   ED Course  Procedures   DIAGNOSTIC STUDIES: Oxygen Saturation is 95% on room air, normal by my interpretation.    COORDINATION OF CARE: 12:25 AM Discussed treatment plan with pt at bedside and pt agreed to plan.  Labs Review Results for orders placed or performed during the hospital encounter of 03/05/14  CBC with Differential  Result Value Ref Range   WBC 6.6 4.0 - 10.5 K/uL   RBC 3.93 (L) 4.22 - 5.81 MIL/uL   Hemoglobin 13.5 13.0 - 17.0 g/dL   HCT 45.439.1 09.839.0 - 11.952.0 %   MCV 99.5 78.0 - 100.0 fL   MCH 34.4 (H) 26.0 - 34.0 pg   MCHC 34.5 30.0 - 36.0 g/dL   RDW 14.711.9 82.911.5 - 56.215.5 %   Platelets 195 150 - 400 K/uL   Neutrophils Relative % 78 (H) 43 - 77 %   Neutro Abs 5.2 1.7 - 7.7 K/uL   Lymphocytes Relative 17 12 - 46 %   Lymphs Abs 1.1 0.7 - 4.0 K/uL   Monocytes Relative 5 3 - 12 %   Monocytes Absolute 0.3 0.1 - 1.0 K/uL   Eosinophils Relative 0 0 - 5 %   Eosinophils Absolute 0.0 0.0 - 0.7 K/uL   Basophils Relative 0 0 - 1 %   Basophils Absolute 0.0 0.0 - 0.1 K/uL  Phenobarbital level  Result Value Ref Range   Phenobarbital 28.9 15.0 - 40.0 ug/mL  Carbamazepine level, total  Result Value Ref Range   Carbamazepine Lvl 8.0 4.0 - 12.0 ug/mL  CK  Result Value Ref Range   Total CK 185 7 - 232 U/L  Comprehensive metabolic panel  Result Value Ref Range   Sodium 134 (L) 135 - 145 mmol/L   Potassium 4.0 3.5 - 5.1 mmol/L   Chloride 103 96 - 112 mmol/L   CO2 29 19 - 32 mmol/L   Glucose, Bld 107 (H) 70 - 99 mg/dL   BUN 15 6 - 23 mg/dL   Creatinine, Ser 1.300.85 0.50 - 1.35 mg/dL   Calcium 8.4 8.4 - 86.510.5 mg/dL   Total Protein 6.6 6.0 - 8.3 g/dL   Albumin 4.1 3.5 - 5.2 g/dL   AST 19 0 - 37 U/L   ALT 17 0 - 53 U/L   Alkaline Phosphatase 72 39 - 117 U/L   Total Bilirubin 0.1 (L) 0.3 - 1.2 mg/dL   GFR calc non Af Amer >90 >90 mL/min   GFR calc Af Amer >90 >90 mL/min   Anion gap 2 (L) 5 - 15  I-Stat CG4 Lactic Acid, ED  Result Value Ref Range   Lactic Acid, Venous 0.57 0.5 - 2.0 mmol/L   Imaging  Review Ct Head Wo Contrast  03/06/2014   CLINICAL DATA:  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  COMPARISON:  06/25/2006  FINDINGS: Skull and Sinuses:  Re- demonstrated right parietal scalp scarring.  Obstructing cerumen bilaterally.  No sinus or mastoid effusion.  There is chronic occipital condyle asymmetry, hypoplastic on the right, with aspherical foramen magnum.  Orbits: No acute abnormality.  Brain: No evidence of acute infarction, hemorrhage, hydrocephalus, or mass lesion/mass effect.  There is a small area of cortical low-attenuation in the right parietal cortex which was not clearly seen in 2008, but has a chronic appearance with no mass effect. The appearance favors gliosis.  IMPRESSION: 1. No acute intracranial findings. 2. Small area of right parietal cortex low-density, likely gliosis. No other cortical findings to explain seizure.   Electronically Signed   By: Marnee Spring M.D.   On: 03/06/2014 01:31     EKG Interpretation   Date/Time:  Monday March 05 2014 23:57:20 EST Ventricular Rate:  99 PR Interval:  145 QRS Duration: 96 QT Interval:  331 QTC Calculation: 425 R Axis:   40 Text Interpretation:  Sinus rhythm Borderline low voltage, extremity leads  When compared with ECG of 06/07/2010, No significant change was found  Confirmed by [facility]  MD, Merdith Adan ([id number]) on 03/06/2014 12:11:04 AM      MDM   Final diagnoses:  Seizure    Complex partial seizure. He is currently on 2 anticonvulsant medications-carbamazepine and phenobarbital. He states it has been a long time since he had any kind of imaging of his brain. He is sent for CT of the head which is unremarkable, and blood levels of carbamazepine and phenobarbital are in the therapeutic range. He was given a dose of lorazepam and symptoms have completely resolved. On  reexam, his speech is fluent and normal and he feels like he is back to his baseline. He may benefit from adjustment of his medications, but this will need to be done by his neurologist.   I personally performed the services described in this documentation, which was scribed in my presence. The recorded information has been reviewed and is accurate.       Dione Booze, MD 03/06/14 [phone]

## 2014-03-05 NOTE — ED Notes (Signed)
Onset of "aura" "dejavue" @  10pm,with aphasia, one hour later began speaking again and felt very cold. States the difference from his "usual " seizure is the length of time, with this one being over an hour and no loss of consciousness. Speech is now halting but clear. Movements spastic but is able to write. Fully alert.

## 2014-03-06 ENCOUNTER — Other Ambulatory Visit (HOSPITAL_COMMUNITY): Payer: Self-pay | Admitting: Internal Medicine

## 2014-03-06 ENCOUNTER — Emergency Department (HOSPITAL_COMMUNITY): Payer: Self-pay

## 2014-03-06 ENCOUNTER — Other Ambulatory Visit (HOSPITAL_COMMUNITY): Payer: Self-pay

## 2014-03-06 DIAGNOSIS — G40801 Other epilepsy, not intractable, with status epilepticus: Secondary | ICD-10-CM

## 2014-03-06 LAB — COMPREHENSIVE METABOLIC PANEL
ALT: 17 U/L (ref 0–53)
ANION GAP: 2 — AB (ref 5–15)
AST: 19 U/L (ref 0–37)
Albumin: 4.1 g/dL (ref 3.5–5.2)
Alkaline Phosphatase: 72 U/L (ref 39–117)
BUN: 15 mg/dL (ref 6–23)
CALCIUM: 8.4 mg/dL (ref 8.4–10.5)
CO2: 29 mmol/L (ref 19–32)
CREATININE: 0.85 mg/dL (ref 0.50–1.35)
Chloride: 103 mmol/L (ref 96–112)
GLUCOSE: 107 mg/dL — AB (ref 70–99)
Potassium: 4 mmol/L (ref 3.5–5.1)
SODIUM: 134 mmol/L — AB (ref 135–145)
TOTAL PROTEIN: 6.6 g/dL (ref 6.0–8.3)
Total Bilirubin: 0.1 mg/dL — ABNORMAL LOW (ref 0.3–1.2)

## 2014-03-06 LAB — CK: CK TOTAL: 185 U/L (ref 7–232)

## 2014-03-06 LAB — CBC WITH DIFFERENTIAL/PLATELET
BASOS ABS: 0 10*3/uL (ref 0.0–0.1)
BASOS PCT: 0 % (ref 0–1)
EOS ABS: 0 10*3/uL (ref 0.0–0.7)
EOS PCT: 0 % (ref 0–5)
HCT: 39.1 % (ref 39.0–52.0)
Hemoglobin: 13.5 g/dL (ref 13.0–17.0)
Lymphocytes Relative: 17 % (ref 12–46)
Lymphs Abs: 1.1 10*3/uL (ref 0.7–4.0)
MCH: 34.4 pg — AB (ref 26.0–34.0)
MCHC: 34.5 g/dL (ref 30.0–36.0)
MCV: 99.5 fL (ref 78.0–100.0)
Monocytes Absolute: 0.3 10*3/uL (ref 0.1–1.0)
Monocytes Relative: 5 % (ref 3–12)
NEUTROS PCT: 78 % — AB (ref 43–77)
Neutro Abs: 5.2 10*3/uL (ref 1.7–7.7)
PLATELETS: 195 10*3/uL (ref 150–400)
RBC: 3.93 MIL/uL — AB (ref 4.22–5.81)
RDW: 11.9 % (ref 11.5–15.5)
WBC: 6.6 10*3/uL (ref 4.0–10.5)

## 2014-03-06 LAB — CBG MONITORING, ED: Glucose-Capillary: 112 mg/dL — ABNORMAL HIGH (ref 70–99)

## 2014-03-06 LAB — PHENOBARBITAL LEVEL: PHENOBARBITAL: 28.9 ug/mL (ref 15.0–40.0)

## 2014-03-06 LAB — CARBAMAZEPINE LEVEL, TOTAL: CARBAMAZEPINE LVL: 8 ug/mL (ref 4.0–12.0)

## 2014-03-06 LAB — I-STAT CG4 LACTIC ACID, ED: LACTIC ACID, VENOUS: 0.57 mmol/L (ref 0.5–2.0)

## 2014-03-06 MED ORDER — LORAZEPAM 2 MG/ML IJ SOLN
1.0000 mg | Freq: Once | INTRAMUSCULAR | Status: AC
  Administered 2014-03-06: 1 mg via INTRAVENOUS
  Filled 2014-03-06: qty 1

## 2014-03-06 MED ORDER — LORAZEPAM 1 MG PO TABS: 1.0000 mg | ORAL_TABLET | Freq: Three times a day (TID) | ORAL | Status: AC | PRN

## 2014-03-06 NOTE — Discharge Instructions (Signed)
Your CAT scan today was normal, and your blood levels of carbamazepine (Tegretol) and phenobarbital were in the range where they should be to control seizures. The fact that you had a seizure in spite of the blood levels being where they should be may indicate a need to change medication. You need to talk with your neurologist about that. Options would include changing the dose of your current medications, or adding another medication.   Epilepsy Epilepsy is a disorder in which a person has repeated seizures over time. A seizure is a release of abnormal electrical activity in the brain. Seizures can cause a change in attention, behavior, or the ability to remain awake and alert (altered mental status). Seizures often involve uncontrollable shaking (convulsions).  Most people with epilepsy lead normal lives. However, people with epilepsy are at an increased risk of falls, accidents, and injuries. Therefore, it is important to begin treatment right away. CAUSES  Epilepsy has many possible causes. Anything that disturbs the normal pattern of brain cell activity can lead to seizures. This may include:   Head injury.  Birth trauma.  High fever as a child.  Stroke.  Bleeding into or around the brain.  Certain drugs.  Prolonged low oxygen, such as what occurs after CPR efforts.  Abnormal brain development.  Certain illnesses, such as meningitis, encephalitis (brain infection), malaria, and other infections.  An imbalance of nerve signaling chemicals (neurotransmitters).  SIGNS AND SYMPTOMS  The symptoms of a seizure can vary greatly from one person to another. Right before a seizure, you may have a warning (aura) that a seizure is about to occur. An aura may include the following symptoms:  Fear or anxiety.  Nausea.  Feeling like the room is spinning (vertigo).  Vision changes, such as seeing flashing lights or spots. Common symptoms during a seizure include:  Abnormal sensations,  such as an abnormal smell or a bitter taste in the mouth.   Sudden, general body stiffness.   Convulsions that involve rhythmic jerking of the face, arm, or leg on one or both sides.   Sudden change in consciousness.   Appearing to be awake but not responding.   Appearing to be asleep but cannot be awakened.   Grimacing, chewing, lip smacking, drooling, tongue biting, or loss of bowel or bladder control. After a seizure, you may feel sleepy for a while. DIAGNOSIS  Your health care provider will ask about your symptoms and take a medical history. Descriptions from any witnesses to your seizures will be very helpful in the diagnosis. A physical exam, including a detailed neurological exam, is necessary. Various tests may be done, such as:   An electroencephalogram (EEG). This is a painless test of your brain waves. In this test, a diagram is created of your brain waves. These diagrams can be interpreted by a specialist.  An MRI of the brain.   A CT scan of the brain.   A spinal tap (lumbar puncture, LP).  Blood tests to check for signs of infection or abnormal blood chemistry. TREATMENT  There is no cure for epilepsy, but it is generally treatable. Once epilepsy is diagnosed, it is important to begin treatment as soon as possible. For most people with epilepsy, seizures can be controlled with medicines. The following may also be used:  A pacemaker for the brain (vagus nerve stimulator) can be used for people with seizures that are not well controlled by medicine.  Surgery on the brain. For some people, epilepsy eventually goes away.  HOME CARE INSTRUCTIONS   Follow your health care provider's recommendations on driving and safety in normal activities.  Get enough rest. Lack of sleep can cause seizures.  Only take over-the-counter or prescription medicines as directed by your health care provider. Take any prescribed medicine exactly as directed.  Avoid any known  triggers of your seizures.  Keep a seizure diary. Record what you recall about any seizure, especially any possible trigger.   Make sure the people you live and work with know that you are prone to seizures. They should receive instructions on how to help you. In general, a witness to a seizure should:   Cushion your head and body.   Turn you on your side.   Avoid unnecessarily restraining you.   Not place anything inside your mouth.   Call for emergency medical help if there is any question about what has occurred.   Follow up with your health care provider as directed. You may need regular blood tests to monitor the levels of your medicine.  SEEK MEDICAL CARE IF:   You develop signs of infection or other illness. This might increase the risk of a seizure.   You seem to be having more frequent seizures.   Your seizure pattern is changing.  SEEK IMMEDIATE MEDICAL CARE IF:   You have a seizure that does not stop after a few moments.   You have a seizure that causes any difficulty in breathing.   You have a seizure that results in a very severe headache.   You have a seizure that leaves you with the inability to speak or use a part of your body.  Document Released: 12/22/2004 Document Revised: 10/12/2012 Document Reviewed: 08/03/2012 [facility] Patient Information 2015 [place], [place]. This information is not intended to replace advice given to you by your health care provider. Make sure you discuss any questions you have with your health care provider.  Lorazepam tablets What is this medicine? LORAZEPAM (lor A ze pam) is a benzodiazepine. It is used to treat anxiety. This medicine may be used for other purposes; ask your health care provider or pharmacist if you have questions. COMMON BRAND NAME(S): Ativan What should I tell my health care provider before I take this medicine? They need to know if you have any of these conditions: -alcohol or drug abuse  problem -bipolar disorder, depression, psychosis or other mental health condition -glaucoma -kidney or liver disease -lung disease or breathing difficulties -myasthenia gravis -Parkinson's disease -seizures or a history of seizures -suicidal thoughts -an unusual or allergic reaction to lorazepam, other benzodiazepines, foods, dyes, or preservatives -pregnant or trying to get pregnant -breast-feeding How should I use this medicine? Take this medicine by mouth with a glass of water. Follow the directions on the prescription label. If it upsets your stomach, take it with food or milk. Take your medicine at regular intervals. Do not take it more often than directed. Do not stop taking except on the advice of your doctor or health care professional. Talk to your pediatrician regarding the use of this medicine in children. Special care may be needed. Overdosage: If you think you have taken too much of this medicine contact a poison control center or emergency room at once. NOTE: This medicine is only for you. Do not share this medicine with others. What if I miss a dose? If you miss a dose, take it as soon as you can. If it is almost time for your next dose, take only that dose.  Do not take double or extra doses. What may interact with this medicine? -barbiturate medicines for inducing sleep or treating seizures, like phenobarbital -clozapine -medicines for depression, mental problems or psychiatric disturbances -medicines for sleep -phenytoin -probenecid -theophylline -valproic acid This list may not describe all possible interactions. Give your health care provider a list of all the medicines, herbs, non-prescription drugs, or dietary supplements you use. Also tell them if you smoke, drink alcohol, or use illegal drugs. Some items may interact with your medicine. What should I watch for while using this medicine? Visit your doctor or health care professional for regular checks on your  progress. Your body may become dependent on this medicine, ask your doctor or health care professional if you still need to take it. However, if you have been taking this medicine regularly for some time, do not suddenly stop taking it. You must gradually reduce the dose or you may get severe side effects. Ask your doctor or health care professional for advice before increasing or decreasing the dose. Even after you stop taking this medicine it can still affect your body for several days. You may get drowsy or dizzy. Do not drive, use machinery, or do anything that needs mental alertness until you know how this medicine affects you. To reduce the risk of dizzy and fainting spells, do not stand or sit up quickly, especially if you are an older patient. Alcohol may increase dizziness and drowsiness. Avoid alcoholic drinks. Do not treat yourself for coughs, colds or allergies without asking your doctor or health care professional for advice. Some ingredients can increase possible side effects. What side effects may I notice from receiving this medicine? Side effects that you should report to your doctor or health care professional as soon as possible: -changes in vision -confusion -depression -mood changes, excitability or aggressive behavior -movement difficulty, staggering or jerky movements -muscle cramps -restlessness -weakness or tiredness Side effects that usually do not require medical attention (report to your doctor or health care professional if they continue or are bothersome): -constipation or diarrhea -difficulty sleeping, nightmares -dizziness, drowsiness -headache -nausea, vomiting This list may not describe all possible side effects. Call your doctor for medical advice about side effects. You may report side effects to FDA at 1-800-FDA-1088. Where should I keep my medicine? Keep out of the reach of children. This medicine can be abused. Keep your medicine in a safe place to protect  it from theft. Do not share this medicine with anyone. Selling or giving away this medicine is dangerous and against the law. Store at room temperature between 20 and 25 degrees C (68 and 77 degrees F). Protect from light. Keep container tightly closed. Throw away any unused medicine after the expiration date. NOTE: This sheet is a summary. It may not cover all possible information. If you have questions about this medicine, talk to your doctor, pharmacist, or health care provider.  2015, [employer]. (2007-06-24 14:58:20)

## 2014-03-09 ENCOUNTER — Ambulatory Visit (HOSPITAL_COMMUNITY)
Admission: RE | Admit: 2014-03-09 | Discharge: 2014-03-09 | Disposition: A | Discharge status: Home / Self Care | Payer: Self-pay | Source: Ambulatory Visit

## 2014-03-09 DIAGNOSIS — G40801 Other epilepsy, not intractable, with status epilepticus: Secondary | ICD-10-CM | POA: Insufficient documentation

## 2014-03-09 MED ORDER — GADOBENATE DIMEGLUMINE 529 MG/ML IV SOLN
17.0000 mL | Freq: Once | INTRAVENOUS | Status: AC | PRN
  Administered 2014-03-09: 17 mL via INTRAVENOUS

## 2015-05-22 IMAGING — CT CT HEAD W/O CM
1 of 3 series · 15 of 30 positions shown, 19 images · non-contrast
Comparison: 06/25/2006

CLINICAL DATA: Seizure disorder with long lasting deja vuaura

EXAM:
CT HEAD WITHOUT CONTRAST
TECHNIQUE: Contiguous axial images were obtained from the base of the skull
through the vertex without intravenous contrast.

[Series 4: headtrauma 2.4 h60s · axial · 0.47mm/px · z∈[+1236,+1383]mm · 15 of 72 slices shown, 19 images]
[im 5/72  brain]
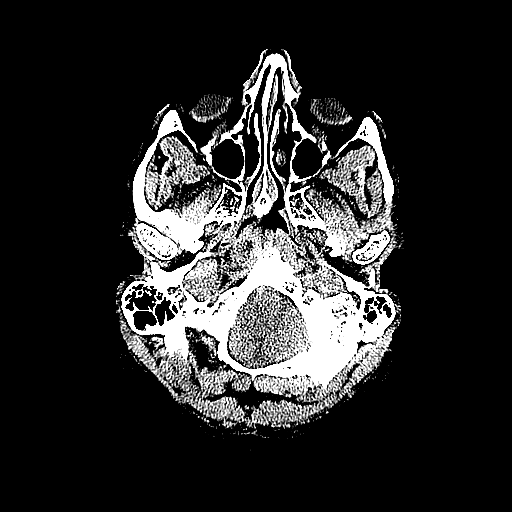
[im 5/72  bone]
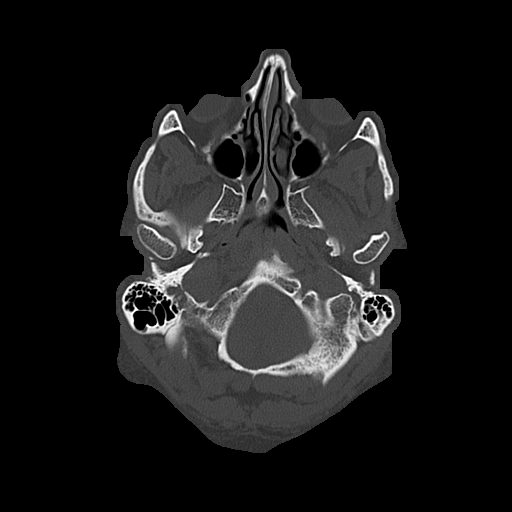
[im 9/72  brain]
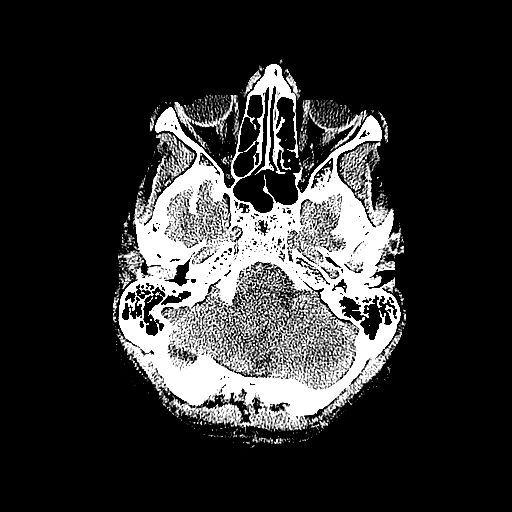
[im 14/72  brain]
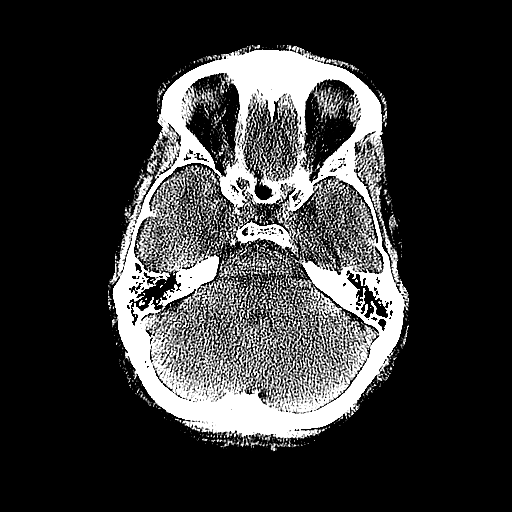
[im 18/72  brain]
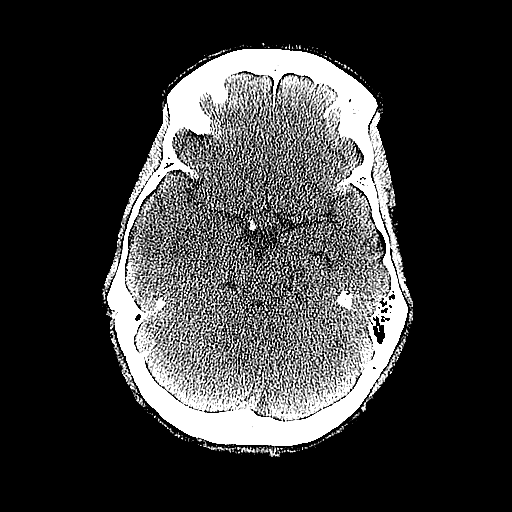
[im 23/72  brain]
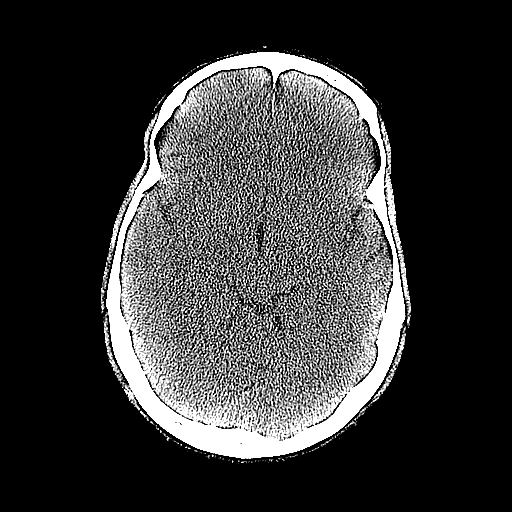
[im 23/72  bone]
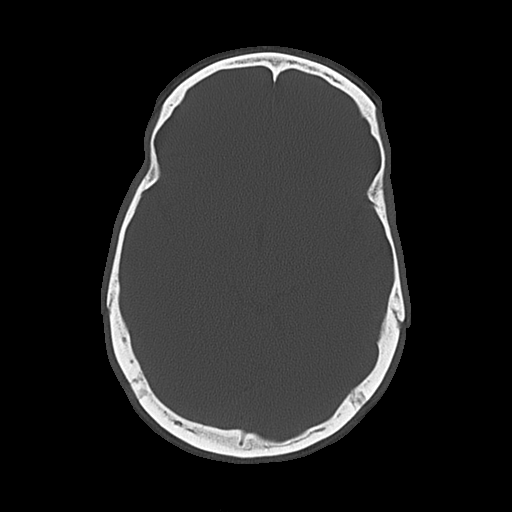
[im 27/72  brain]
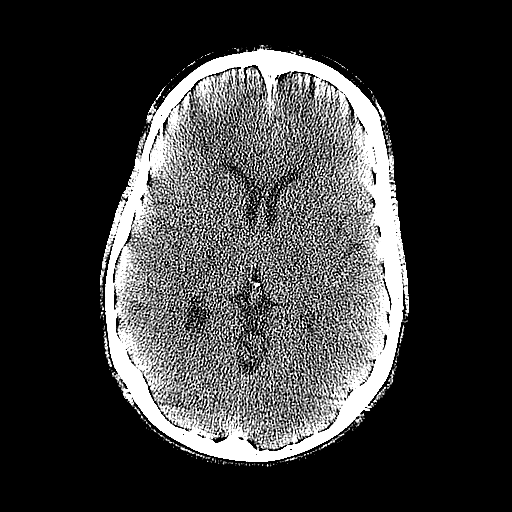
[im 32/72  brain]
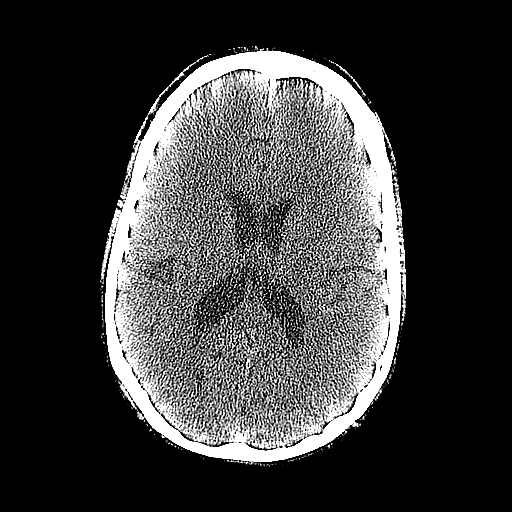
[im 36/72  brain]
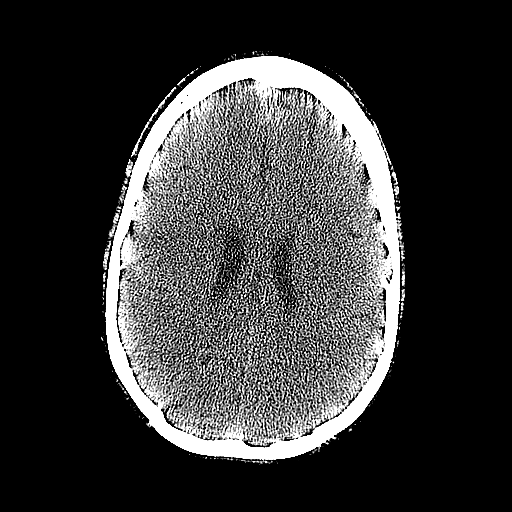
[im 40/72  brain]
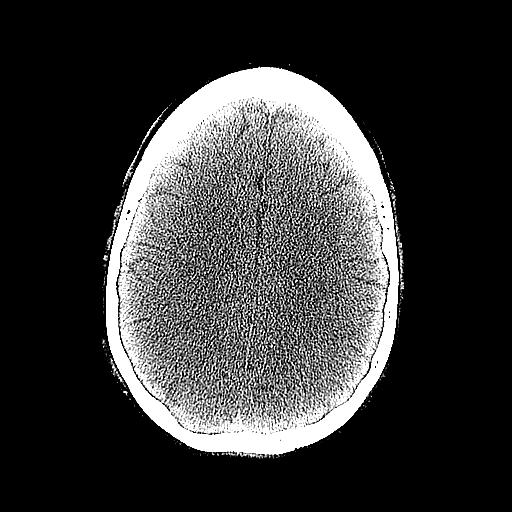
[im 40/72  bone]
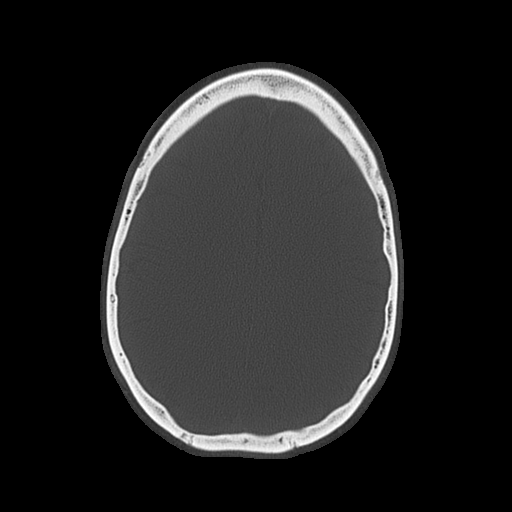
[im 45/72  brain]
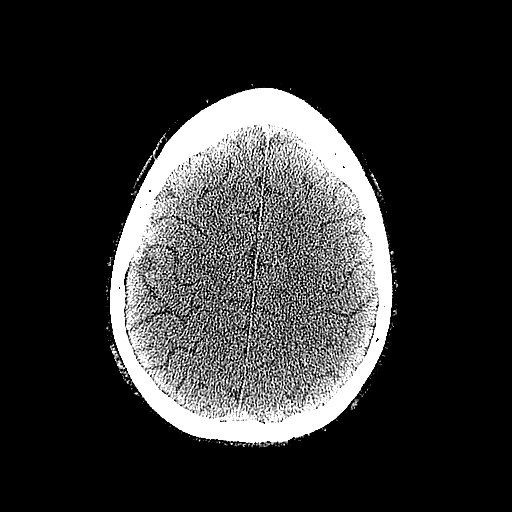
[im 49/72  brain]
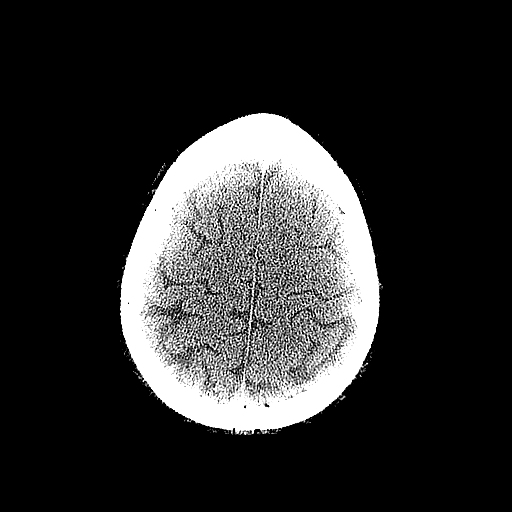
[im 54/72  brain]
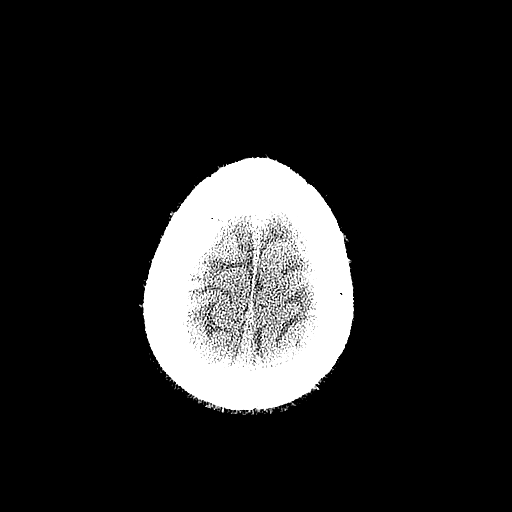
[im 58/72  brain]
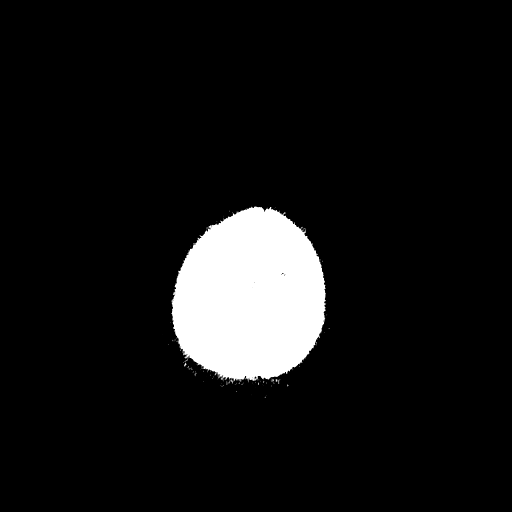
[im 58/72  bone]
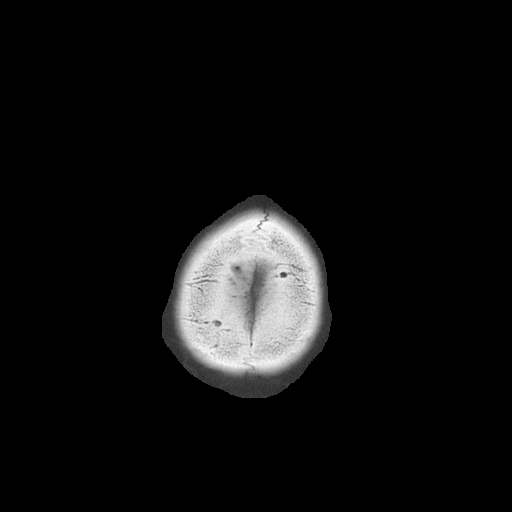
[im 63/72  brain]
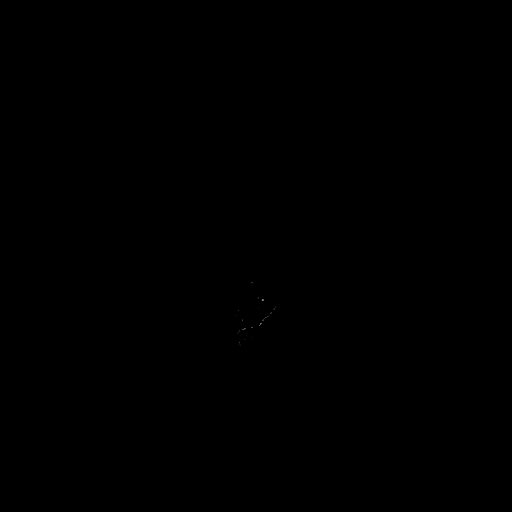
[im 67/72  brain]
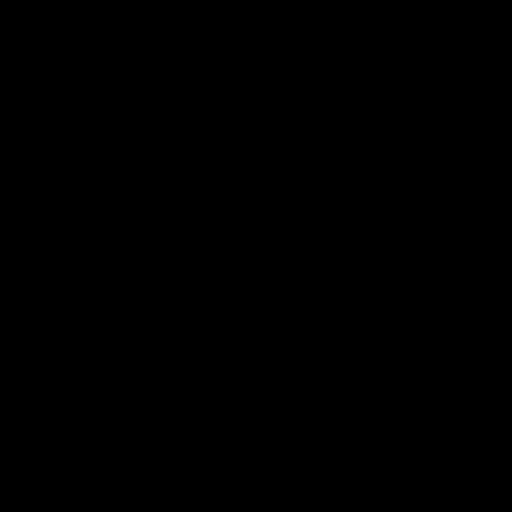

[15 of 30 positions shown; findings below may reference images not displayed]

FINDINGS: Skull and Sinuses:

Re- demonstrated right parietal scalp scarring.

Obstructing cerumen bilaterally.

No sinus or mastoid effusion.

There is chronic occipital condyle asymmetry, hypoplastic on the
right, with aspherical foramen magnum.

Orbits: No acute abnormality.

Brain: No evidence of acute infarction, hemorrhage, hydrocephalus,
or mass lesion/mass effect.

There is a small area of cortical low-attenuation in the right
parietal cortex which was not clearly seen in 7990, but has a
chronic appearance with no mass effect. The appearance favors
gliosis.
IMPRESSION: 1. No acute intracranial findings.
2. Small area of right parietal cortex low-density, likely gliosis.
No other cortical findings to explain seizure.
